# Patient Record
Sex: Female | Born: 1990 | Hispanic: Yes | Marital: Single | Smoking: Former smoker
Health system: Southern US, Community
[De-identification: ages and names within clinical notes are randomized; demographics above are authoritative.]

## PROBLEM LIST (undated history)

## (undated) DIAGNOSIS — R87619 Unspecified abnormal cytological findings in specimens from cervix uteri: Secondary | ICD-10-CM

## (undated) DIAGNOSIS — K589 Irritable bowel syndrome without diarrhea: Secondary | ICD-10-CM

## (undated) HISTORY — PX: LAPAROSCOPIC APPENDECTOMY: SUR753

## (undated) HISTORY — DX: Irritable bowel syndrome, unspecified: K58.9

## (undated) HISTORY — PX: LEEP: SHX91

## (undated) HISTORY — DX: Unspecified abnormal cytological findings in specimens from cervix uteri: R87.619

## (undated) HISTORY — PX: WISDOM TOOTH EXTRACTION: SHX21

---

## 2013-10-23 ENCOUNTER — Encounter: Payer: Self-pay | Admitting: Nurse Practitioner

## 2013-10-23 ENCOUNTER — Ambulatory Visit (INDEPENDENT_AMBULATORY_CARE_PROVIDER_SITE_OTHER): Payer: Medicaid Other | Admitting: Nurse Practitioner

## 2013-10-23 ENCOUNTER — Other Ambulatory Visit (HOSPITAL_COMMUNITY)
Admission: RE | Admit: 2013-10-23 | Discharge: 2013-10-23 | Disposition: A | Discharge status: Home / Self Care | Payer: Medicaid Other | Source: Ambulatory Visit | Attending: Nurse Practitioner | Admitting: Nurse Practitioner

## 2013-10-23 VITALS — BP 116/75 | HR 80 | Resp 16 | Ht <= 58 in | Wt 136.0 lb

## 2013-10-23 DIAGNOSIS — Z0000 Encounter for general adult medical examination without abnormal findings: Secondary | ICD-10-CM

## 2013-10-23 DIAGNOSIS — Z01419 Encounter for gynecological examination (general) (routine) without abnormal findings: Secondary | ICD-10-CM | POA: Insufficient documentation

## 2013-10-23 DIAGNOSIS — Z124 Encounter for screening for malignant neoplasm of cervix: Secondary | ICD-10-CM

## 2013-10-23 DIAGNOSIS — Z113 Encounter for screening for infections with a predominantly sexual mode of transmission: Secondary | ICD-10-CM | POA: Insufficient documentation

## 2013-10-23 DIAGNOSIS — Z30431 Encounter for routine checking of intrauterine contraceptive device: Secondary | ICD-10-CM | POA: Insufficient documentation

## 2013-10-23 DIAGNOSIS — N949 Unspecified condition associated with female genital organs and menstrual cycle: Secondary | ICD-10-CM

## 2013-10-23 DIAGNOSIS — N83201 Unspecified ovarian cyst, right side: Secondary | ICD-10-CM | POA: Insufficient documentation

## 2013-10-23 DIAGNOSIS — N83209 Unspecified ovarian cyst, unspecified side: Secondary | ICD-10-CM

## 2013-10-23 NOTE — Patient Instructions (Signed)
Ovarian Cyst An ovarian cyst is a fluid-filled sac that forms on an ovary. The ovaries are small organs that produce eggs in women. Various types of cysts can form on the ovaries. Most are not cancerous. Many do not cause problems, and they often go away on their own. Some may cause symptoms and require treatment. Common types of ovarian cysts include:  Functional cysts--These cysts may occur every month during the menstrual cycle. This is normal. The cysts usually go away with the next menstrual cycle if the woman does not get pregnant. Usually, there are no symptoms with a functional cyst.  Endometrioma cysts--These cysts form from the tissue that lines the uterus. They are also called "chocolate cysts" because they become filled with blood that turns brown. This type of cyst can cause pain in the lower abdomen during intercourse and with your menstrual period.  Cystadenoma cysts--This type develops from the cells on the outside of the ovary. These cysts can get very big and cause lower abdomen pain and pain with intercourse. This type of cyst can twist on itself, cut off its blood supply, and cause severe pain. It can also easily rupture and cause a lot of pain.  Dermoid cysts--This type of cyst is sometimes found in both ovaries. These cysts may contain different kinds of body tissue, such as skin, teeth, hair, or cartilage. They usually do not cause symptoms unless they get very big.  Theca lutein cysts--These cysts occur when too much of a certain hormone (human chorionic gonadotropin) is produced and overstimulates the ovaries to produce an egg. This is most common after procedures used to assist with the conception of a baby (in vitro fertilization). CAUSES   Fertility drugs can cause a condition in which multiple large cysts are formed on the ovaries. This is called ovarian hyperstimulation syndrome.  A condition called polycystic ovary syndrome can cause hormonal imbalances that can lead to  nonfunctional ovarian cysts. SIGNS AND SYMPTOMS  Many ovarian cysts do not cause symptoms. If symptoms are present, they may include:  Pelvic pain or pressure.  Pain in the lower abdomen.  Pain during sexual intercourse.  Increasing girth (swelling) of the abdomen.  Abnormal menstrual periods.  Increasing pain with menstrual periods.  Stopping having menstrual periods without being pregnant. DIAGNOSIS  These cysts are commonly found during a routine or annual pelvic exam. Tests may be ordered to find out more about the cyst. These tests may include:  Ultrasound.  X-ray of the pelvis.  CT scan.  MRI.  Blood tests. TREATMENT  Many ovarian cysts go away on their own without treatment. Your health care provider may want to check your cyst regularly for 2-3 months to see if it changes. For women in menopause, it is particularly important to monitor a cyst closely because of the higher rate of ovarian cancer in menopausal women. When treatment is needed, it may include any of the following:  A procedure to drain the cyst (aspiration). This may be done using a long needle and ultrasound. It can also be done through a laparoscopic procedure. This involves using a thin, lighted tube with a tiny camera on the end (laparoscope) inserted through a small incision.  Surgery to remove the whole cyst. This may be done using laparoscopic surgery or an open surgery involving a larger incision in the lower abdomen.  Hormone treatment or birth control pills. These methods are sometimes used to help dissolve a cyst. HOME CARE INSTRUCTIONS   Only take over-the-counter   or prescription medicines as directed by your health care provider.  Follow up with your health care provider as directed.  Get regular pelvic exams and Pap tests. SEEK MEDICAL CARE IF:   Your periods are late, irregular, or painful, or they stop.  Your pelvic pain or abdominal pain does not go away.  Your abdomen becomes  larger or swollen.  You have pressure on your bladder or trouble emptying your bladder completely.  You have pain during sexual intercourse.  You have feelings of fullness, pressure, or discomfort in your stomach.  You lose weight for no apparent reason.  You feel generally ill.  You become constipated.  You lose your appetite.  You develop acne.  You have an increase in body and facial hair.  You are gaining weight, without changing your exercise and eating habits.  You think you are pregnant. SEEK IMMEDIATE MEDICAL CARE IF:   You have increasing abdominal pain.  You feel sick to your stomach (nauseous), and you throw up (vomit).  You develop a fever that comes on suddenly.  You have abdominal pain during a bowel movement.  Your menstrual periods become heavier than usual. MAKE SURE YOU:  Understand these instructions.  Will watch your condition.  Will get help right away if you are not doing well or get worse. Document Released: 04/05/2005 Document Revised: 04/10/2013 Document Reviewed: 12/11/2012 Exit[employer] Patient Information 2015 [employer]. This information is not intended to replace advice given to you by your health care provider. Make sure you discuss any questions you have with your health care provider.  

## 2013-10-23 NOTE — Progress Notes (Signed)
History:  Stephanie Pratt is a 23 y.o. G1P1001 who presents to [facility] today for New GYN care. She has not had pap smear since the birth of child. Her paps have always been slightly abnormal. She has a [name] for 3 years and is happy with that for birth control but has some pains with tight clothing and with intercourse. She is afraid IUD maybe malpositioned. She is also complaining of vaginal odor. She has same sexual partner. She also had complaints of migraine without aura, anxiety, depression and nausea. No contraindications to BCP's  The following portions of the patient's history were reviewed and updated as appropriate: allergies, current medications, past family history, past medical history, past social history, past surgical history and problem list.  Review of Systems:  Pertinent items are noted in HPI.  Objective:  Physical Exam BP 116/75  Pulse 80  Resp 16  Ht 4\' 10"  (1.473 m)  Wt 136 lb (61.689 kg)  BMI 28.43 kg/m2 GENERAL: Well-developed, well-nourished female in no acute distress.  HEENT: Normocephalic, atraumatic.  NECK: Supple. Normal thyroid.  LUNGS: Normal rate. Clear to auscultation bilaterally.  HEART: Regular rate and rhythm with no adventitious sounds.  BREASTS: Symmetric in size. No masses, skin changes, nipple drainage, or lymphadenopathy. Fibrocystic breasts bilateral ABDOMEN: Soft, nontender, nondistended. No organomegaly. Normal bowel sounds appreciated in all quadrants.  PELVIC: Normal external female genitalia. Vagina is pink and rugated.  Normal discharge. Normal cervix contour. IUD strings seen. Pap smear obtained. Uterus is normal in size. Tenderness in right ovary EXTREMITIES: No cyanosis, clubbing, or edema, 2+ distal pulses.   Labs and Imaging  Transvaginal U/S done by Karie FetchLaura Clark, RN shows 4.7 cm x 3.4 cm right ovarian cyst. IUD positioned correctly  Assessment & Plan:  Assessment:  Well Woman Exam Contraception/ IUD Right  ovarian cyst Migraine  Plans: U/S today and repeat in 3 months Start Stephanie Pratt BCP for 3 months/ samples given TSH, CBC, CMET, wet prep, GC, Chlamydia, pap today Advised to make an appointment for follow up with migraines in one month  Delbert PhenixLinda M Gavinn Collard, NP 10/23/2013 10:13 AM

## 2013-10-24 ENCOUNTER — Telehealth: Payer: Self-pay | Admitting: *Deleted

## 2013-10-24 LAB — CBC
HCT: 43.3 % (ref 36.0–46.0)
HEMOGLOBIN: 14.4 g/dL (ref 12.0–15.0)
MCH: 31.7 pg (ref 26.0–34.0)
MCHC: 33.3 g/dL (ref 30.0–36.0)
MCV: 95.4 fL (ref 78.0–100.0)
PLATELETS: 247 10*3/uL (ref 150–400)
RBC: 4.54 MIL/uL (ref 3.87–5.11)
RDW: 13.8 % (ref 11.5–15.5)
WBC: 9.6 10*3/uL (ref 4.0–10.5)

## 2013-10-24 LAB — COMPREHENSIVE METABOLIC PANEL
ALBUMIN: 4.2 g/dL (ref 3.5–5.2)
AST: 11 U/L (ref 0–37)
Alkaline Phosphatase: 53 U/L (ref 39–117)
BUN: 16 mg/dL (ref 6–23)
CHLORIDE: 109 meq/L (ref 96–112)
CO2: 28 meq/L (ref 19–32)
Calcium: 8.9 mg/dL (ref 8.4–10.5)
Creat: 0.65 mg/dL (ref 0.50–1.10)
GLUCOSE: 80 mg/dL (ref 70–99)
POTASSIUM: 4.3 meq/L (ref 3.5–5.3)
SODIUM: 140 meq/L (ref 135–145)
Total Bilirubin: 0.7 mg/dL (ref 0.2–1.2)
Total Protein: 6.5 g/dL (ref 6.0–8.3)

## 2013-10-24 LAB — CYTOLOGY - PAP

## 2013-10-24 LAB — TSH: TSH: 0.62 u[IU]/mL (ref 0.350–4.500)

## 2013-10-24 NOTE — Telephone Encounter (Signed)
Pt notified of normal labs

## 2013-10-25 ENCOUNTER — Telehealth: Payer: Self-pay | Admitting: *Deleted

## 2013-10-25 DIAGNOSIS — B9689 Other specified bacterial agents as the cause of diseases classified elsewhere: Secondary | ICD-10-CM

## 2013-10-25 DIAGNOSIS — N760 Acute vaginitis: Principal | ICD-10-CM

## 2013-10-25 LAB — WET PREP, GENITAL
Trich, Wet Prep: NONE SEEN
Yeast Wet Prep HPF POC: NONE SEEN

## 2013-10-25 MED ORDER — METRONIDAZOLE 500 MG PO TABS: 500.0000 mg | ORAL_TABLET | Freq: Two times a day (BID) | ORAL | Status: DC

## 2013-10-25 NOTE — Telephone Encounter (Signed)
Message copied by Granville LewisLARK, Maressa Apollo L on Thu Oct 25, 2013  1:12 PM ------      Message from: BAREFOOT, LINDA M      Created: Thu Oct 25, 2013  1:07 PM       Mervyn GayLora, can we send in a prescription for Flagyl 500 mg BID x 7 days,      Thanks, Bonita QuinLinda ------

## 2013-11-20 ENCOUNTER — Ambulatory Visit (INDEPENDENT_AMBULATORY_CARE_PROVIDER_SITE_OTHER): Payer: Medicaid Other | Admitting: Nurse Practitioner

## 2013-11-20 ENCOUNTER — Telehealth: Payer: Self-pay

## 2013-11-20 ENCOUNTER — Encounter: Payer: Self-pay | Admitting: Nurse Practitioner

## 2013-11-20 VITALS — BP 125/85 | HR 82 | Resp 16 | Ht <= 58 in | Wt 137.0 lb

## 2013-11-20 DIAGNOSIS — M62838 Other muscle spasm: Secondary | ICD-10-CM

## 2013-11-20 DIAGNOSIS — N83201 Unspecified ovarian cyst, right side: Secondary | ICD-10-CM

## 2013-11-20 DIAGNOSIS — G4700 Insomnia, unspecified: Secondary | ICD-10-CM | POA: Insufficient documentation

## 2013-11-20 DIAGNOSIS — G43009 Migraine without aura, not intractable, without status migrainosus: Secondary | ICD-10-CM

## 2013-11-20 DIAGNOSIS — F411 Generalized anxiety disorder: Secondary | ICD-10-CM

## 2013-11-20 DIAGNOSIS — N83209 Unspecified ovarian cyst, unspecified side: Secondary | ICD-10-CM

## 2013-11-20 DIAGNOSIS — F419 Anxiety disorder, unspecified: Secondary | ICD-10-CM | POA: Insufficient documentation

## 2013-11-20 MED ORDER — CYCLOBENZAPRINE HCL 10 MG PO TABS: 10.0000 mg | ORAL_TABLET | Freq: Every evening | ORAL | Status: DC | PRN

## 2013-11-20 MED ORDER — SUMATRIPTAN SUCCINATE 100 MG PO TABS: 100.0000 mg | ORAL_TABLET | Freq: Once | ORAL | Status: DC | PRN

## 2013-11-20 MED ORDER — IBUPROFEN 800 MG PO TABS: 800.0000 mg | ORAL_TABLET | Freq: Three times a day (TID) | ORAL | Status: DC

## 2013-11-20 MED ORDER — TOPIRAMATE 25 MG PO TABS: 25.0000 mg | ORAL_TABLET | Freq: Three times a day (TID) | ORAL | Status: DC

## 2013-11-20 NOTE — Progress Notes (Signed)
Diagnosis: Migraine without Aura, insomnia, Anxiety  History:Stephanie Pratt 23 y.o. G1P1001 [facility]. She has had migraines since she was 23 years old. Her mother has severe migraines. Her main issues are stress and insomnia. She has been dealing with ill father, FOB issues, single motherhood, boyfriend issues and work issues. She has always been a light sleeper. It takes her about 2 hours to fall asleep and she is usually up only once at night. Her average is 6 hours sleep/ night. She feels she has racing thoughts at night and restlessness as well as muscle spasm in neck that keep her awake. She has been having daily migraines for about the last 3-4 years.   Location:Right and left temple  Number of Headache days/month: daily Severe:4 Moderate:20 Mild:6  Current Outpatient Prescriptions on File Prior to Visit  Medication Sig Dispense Refill  . levonorgestrel (MIRENA) 20 MCG/24HR IUD 1 each by Intrauterine route once.       No current facility-administered medications on file prior to visit.    Acute/ prevention: OTC medications  Past Medical History  Diagnosis Date  . IBS (irritable bowel syndrome)   . Abnormal Pap smear of cervix    Past Surgical History  Procedure Laterality Date  . Laparoscopic appendectomy    . Wisdom tooth extraction     Family History  Problem Relation Age of Onset  . Diabetes Father   . Heart disease Father   . Hypertension Father   . COPD Father   . Congestive Heart Failure Father   . Vascular Disease Father   . Cancer Maternal Grandmother     breast   Social History:  reports that she has been smoking Cigarettes.  She has a .6 pack-year smoking history. She has never used smokeless tobacco. She reports that she drinks alcohol. She reports that she does not use illicit drugs. Allergies: No Known Allergies  Triggers: Allergies  Birth control: Mirenia IUD  ROS: Positive for migraines,  insomnia, anxiety, muscle spasms in neck. Negative for any cardiac issues  Exam: well developed, well nourished, caucasian female  General:NAD HEENT:Negative Cardiac:RRR Lungs:clear Neuro:Negative Skin:Warm and dry  Impression:migraine - common, chronic Insomnia Anxiety Muscle spasm  Plan: Discussed the pathophysiology of migraine and medication management of migraines, insomnia, and anxiety. She will start Topamax at 25 mg x 1 week, 50 mg x 1 week then up to 75 mg till seen. She will use flexeril for muscle spasm. She will be given Imitrex and Motrin to take when she gets moderate to severe migraine. We will try to get her into some counseling. RTC 6 weeks   Time Spent:45 minutes

## 2013-11-20 NOTE — Patient Instructions (Signed)

## 2013-11-20 NOTE — Telephone Encounter (Signed)
Called to patient to let her know we have her schedule for US on 01-18-14 and also to call her primary care to get scheduled to get a referral  [facility] since [place] we will not be able to refer her. (per [facility])

## 2014-01-18 ENCOUNTER — Other Ambulatory Visit: Payer: Medicaid Other

## 2014-01-21 ENCOUNTER — Ambulatory Visit (INDEPENDENT_AMBULATORY_CARE_PROVIDER_SITE_OTHER): Payer: Medicaid Other

## 2014-01-21 DIAGNOSIS — Z975 Presence of (intrauterine) contraceptive device: Secondary | ICD-10-CM

## 2014-01-21 DIAGNOSIS — N830 Follicular cyst of ovary: Secondary | ICD-10-CM

## 2014-01-21 DIAGNOSIS — N83201 Unspecified ovarian cyst, right side: Secondary | ICD-10-CM

## 2014-01-22 ENCOUNTER — Ambulatory Visit (INDEPENDENT_AMBULATORY_CARE_PROVIDER_SITE_OTHER): Payer: Medicaid Other | Admitting: Nurse Practitioner

## 2014-01-22 ENCOUNTER — Encounter: Payer: Self-pay | Admitting: Nurse Practitioner

## 2014-01-22 VITALS — BP 113/71 | HR 81 | Resp 16 | Ht <= 58 in | Wt 140.0 lb

## 2014-01-22 DIAGNOSIS — K219 Gastro-esophageal reflux disease without esophagitis: Secondary | ICD-10-CM

## 2014-01-22 DIAGNOSIS — R102 Pelvic and perineal pain: Secondary | ICD-10-CM

## 2014-01-22 DIAGNOSIS — F419 Anxiety disorder, unspecified: Secondary | ICD-10-CM

## 2014-01-22 DIAGNOSIS — G43009 Migraine without aura, not intractable, without status migrainosus: Secondary | ICD-10-CM

## 2014-01-22 MED ORDER — LANSOPRAZOLE 15 MG PO CPDR: 15.0000 mg | DELAYED_RELEASE_CAPSULE | Freq: Every day | ORAL | Status: DC

## 2014-01-22 MED ORDER — RIZATRIPTAN BENZOATE 10 MG PO TABS: 10.0000 mg | ORAL_TABLET | ORAL | Status: DC | PRN

## 2014-01-22 NOTE — Progress Notes (Signed)
History:  Stephanie Pratt is a 23 y.o. G1P1001 who presents to [facility] clinic today for follow up with migraine headaches and review ultrasound. She is taking her topamax on a prn basis due to side effect of being sleepy. She did not like the side effects of Imitrex either. Her headaches are better and now are more tension . She got a job and that seems to have helped.   The following portions of the patient's history were reviewed and updated as appropriate: allergies, current medications, past family history, past medical history, past social history, past surgical history and problem list.  Review of Systems:    Objective:  Physical Exam BP 113/71  Pulse 81  Resp 16  Ht 4\' 10"  (1.473 m)  Wt 140 lb (63.504 kg)  BMI 29.27 kg/m2 GENERAL: Well-developed, well-nourished female in no acute distress.  HEENT: Normocephalic, atraumatic.  NECK: Supple. Normal thyroid.  LUNGS: Normal rate. Clear to auscultation bilaterally.  HEART: Regular rate and rhythm with no adventitious sounds.  EXTREMITIES: No cyanosis, clubbing, or edema, 2+ distal pulses.   Labs and Imaging US Transvaginal Non-ob  01/21/2014   CLINICAL DATA:  Right-sided ovarian cyst diagnosed 3 months ago. Patient told that this was a 3 cm right ovarian cyst. Patient has been given oral birth control pills to help reduce size of the cyst. Patient reports irregular vaginal spotting. Patient does not know her last menstrual. History of an IUD placed 4 years ago.  EXAM: TRANSABDOMINAL AND TRANSVAGINAL ULTRASOUND OF PELVIS  TECHNIQUE: Both transabdominal and transvaginal ultrasound examinations of the pelvis were performed. Transabdominal technique was performed for global imaging of the pelvis including uterus, ovaries, adnexal regions, and pelvic cul-de-sac. It was necessary to proceed with endovaginal exam following the transabdominal exam to visualize the uterus, endometrium and ovaries to better advantage.  COMPARISON:  None   FINDINGS: Uterus  Measurements: 6.7 cm x 3.7 cm x 3.8 cm. No fibroids or other mass visualized.  Endometrium  Thickness: 4.7 mm. There is shadowing from the IUD. IUD appears well positioned in the central to upper endometrial cavity. No endometrial mass or fluid.  Right ovary  Measurements: 3.7 cm x 3.0 cm x 3.4 cm. Multiple follicular cysts. Dominant cyst measures 12.6 mm in greatest dimension. No complex cysts or solid ovarian masses. Normal color Doppler blood flow to the right ovary.  Left ovary  Not visualized. Bowel gas obscures portions of the left adnexa. No adnexal mass.  Other findings  No free fluid.  IMPRESSION: 1. No acute findings. 2. Normal sized right ovarian follicular cysts. Left ovary not visualized. No adnexal masses. 3. IUD appears well positioned in the endometrial cavity.   Electronically Signed   By: Amie Portland M.D.   On: 01/21/2014 11:07   US Pelvis Complete  01/21/2014   CLINICAL DATA:  Right-sided ovarian cyst diagnosed 3 months ago. Patient told that this was a 3 cm right ovarian cyst. Patient has been given oral birth control pills to help reduce size of the cyst. Patient reports irregular vaginal spotting. Patient does not know her last menstrual. History of an IUD placed 4 years ago.  EXAM: TRANSABDOMINAL AND TRANSVAGINAL ULTRASOUND OF PELVIS  TECHNIQUE: Both transabdominal and transvaginal ultrasound examinations of the pelvis were performed. Transabdominal technique was performed for global imaging of the pelvis including uterus, ovaries, adnexal regions, and pelvic cul-de-sac. It was necessary to proceed with endovaginal exam following the transabdominal exam to visualize the uterus, endometrium and ovaries to better advantage.  COMPARISON:  None  FINDINGS: Uterus  Measurements: 6.7 cm x 3.7 cm x 3.8 cm. No fibroids or other mass visualized.  Endometrium  Thickness: 4.7 mm. There is shadowing from the IUD. IUD appears well positioned in the central to upper endometrial cavity.  No endometrial mass or fluid.  Right ovary  Measurements: 3.7 cm x 3.0 cm x 3.4 cm. Multiple follicular cysts. Dominant cyst measures 12.6 mm in greatest dimension. No complex cysts or solid ovarian masses. Normal color Doppler blood flow to the right ovary.  Left ovary  Not visualized. Bowel gas obscures portions of the left adnexa. No adnexal mass.  Other findings  No free fluid.  IMPRESSION: 1. No acute findings. 2. Normal sized right ovarian follicular cysts. Left ovary not visualized. No adnexal masses. 3. IUD appears well positioned in the endometrial cavity.   Electronically Signed   By: Amie Portlandavid  Ormond M.D.   On: 01/21/2014 11:07    Assessment & Plan:  Assessment: Migraine  Pelvic Pains GERD  Plans: Encouraged pt to take Topamax daily, may split dose in half if needed Will change Imitrex to Maxalt Reviewed U/S results Trial Prevacid Follow up 3 months  Delbert PhenixLinda M Rylon Poitra, NP 01/22/2014 4:32 PM

## 2014-01-22 NOTE — Patient Instructions (Signed)

## 2014-02-19 ENCOUNTER — Encounter: Payer: Self-pay | Admitting: Nurse Practitioner

## 2014-04-23 ENCOUNTER — Encounter: Payer: Medicaid Other | Admitting: Nurse Practitioner

## 2015-01-06 ENCOUNTER — Encounter: Payer: Self-pay | Admitting: Obstetrics and Gynecology

## 2015-01-06 ENCOUNTER — Ambulatory Visit (INDEPENDENT_AMBULATORY_CARE_PROVIDER_SITE_OTHER): Payer: Medicaid Other | Admitting: Obstetrics and Gynecology

## 2015-01-06 ENCOUNTER — Other Ambulatory Visit (HOSPITAL_COMMUNITY)
Admission: RE | Admit: 2015-01-06 | Discharge: 2015-01-06 | Disposition: A | Discharge status: Home / Self Care | Payer: Medicaid Other | Source: Ambulatory Visit | Attending: Obstetrics and Gynecology | Admitting: Obstetrics and Gynecology

## 2015-01-06 VITALS — BP 126/71 | HR 86 | Resp 16 | Ht <= 58 in | Wt 135.0 lb

## 2015-01-06 DIAGNOSIS — Z113 Encounter for screening for infections with a predominantly sexual mode of transmission: Secondary | ICD-10-CM | POA: Diagnosis not present

## 2015-01-06 DIAGNOSIS — Z124 Encounter for screening for malignant neoplasm of cervix: Secondary | ICD-10-CM | POA: Diagnosis not present

## 2015-01-06 DIAGNOSIS — Z30431 Encounter for routine checking of intrauterine contraceptive device: Secondary | ICD-10-CM

## 2015-01-06 DIAGNOSIS — Z01419 Encounter for gynecological examination (general) (routine) without abnormal findings: Secondary | ICD-10-CM

## 2015-01-06 NOTE — Progress Notes (Signed)
  Subjective:     Stephanie Pratt is a 24 y.o. female G1P1 with BMI 28 who is here for a comprehensive physical exam. The patient reports no problems. She is sexually active using IUD for contraception. She is content with her IUD.  She denies any pelvic pain or abnormal vaginal bleeding.  Social History   Social History  . Marital Status: Single    Spouse Name: N/A  . Number of Children: N/A  . Years of Education: N/A   Occupational History  . student    Social History Main Topics  . Smoking status: Light Tobacco Smoker -- 0.30 packs/day for 2 years    Types: Cigarettes  . Smokeless tobacco: Never Used  . Alcohol Use: Yes     Comment: occassional wine  . Drug Use: No  . Sexual Activity:    Partners: Male    Birth Control/ Protection: IUD   Other Topics Concern  . Not on file   Social History Narrative   Health Maintenance  Topic Date Due  . HIV Screening  12/04/2003  . TETANUS/TDAP  12/04/2007  . INFLUENZA VACCINE  11/18/2014  . PAP SMEAR  10/23/2016       Review of Systems Pertinent items are noted in HPI.   Objective:      GENERAL: Well-developed, well-nourished female in no acute distress.  HEENT: Normocephalic, atraumatic. Sclerae anicteric.  NECK: Supple. Normal thyroid.  LUNGS: Clear to auscultation bilaterally.  HEART: Regular rate and rhythm. BREASTS: Symmetric in size. No palpable masses or lymphadenopathy, skin changes, or nipple drainage. ABDOMEN: Soft, nontender, nondistended. No organomegaly. PELVIC: Normal external female genitalia. Vagina is pink and rugated.  Normal discharge. Normal appearing cervix. IUD strings visualized. Uterus is normal in size. No adnexal mass or tenderness. EXTREMITIES: No cyanosis, clubbing, or edema, 2+ distal pulses.    Assessment:    Healthy female exam.      Plan:    pap smear collected Smoking cessation discussed Mirena IUD almost 26 years old. Patient is interested in different method but may return  for another IUD insertion. Information provided on contraception options Advised patient to perform monthly self breast and vulva exam Patient will be contacted with any abnormal results RTC in a year or prn See After Visit Summary for Counseling Recommendations

## 2015-01-06 NOTE — Patient Instructions (Signed)
Contraception Choices Contraception (birth control) is the use of any methods or devices to prevent pregnancy. Below are some methods to help avoid pregnancy. HORMONAL METHODS   Contraceptive implant. This is a thin, plastic tube containing progesterone hormone. It does not contain estrogen hormone. Your health care Azul Coffie inserts the tube in the inner part of the upper arm. The tube can remain in place for up to 3 years. After 3 years, the implant must be removed. The implant prevents the ovaries from releasing an egg (ovulation), thickens the cervical mucus to prevent sperm from entering the uterus, and thins the lining of the inside of the uterus.  Progesterone-only injections. These injections are given every 3 months by your health care Rylin Seavey to prevent pregnancy. This synthetic progesterone hormone stops the ovaries from releasing eggs. It also thickens cervical mucus and changes the uterine lining. This makes it harder for sperm to survive in the uterus.  Birth control pills. These pills contain estrogen and progesterone hormone. They work by preventing the ovaries from releasing eggs (ovulation). They also cause the cervical mucus to thicken, preventing the sperm from entering the uterus. Birth control pills are prescribed by a health care Corrine Tillis.Birth control pills can also be used to treat heavy periods.  Minipill. This type of birth control pill contains only the progesterone hormone. They are taken every day of each month and must be prescribed by your health care Hennessey Cantrell.  Birth control patch. The patch contains hormones similar to those in birth control pills. It must be changed once a week and is prescribed by a health care Marcella Charlson.  Vaginal ring. The ring contains hormones similar to those in birth control pills. It is left in the vagina for 3 weeks, removed for 1 week, and then a new one is put back in place. The patient must be comfortable inserting and removing the ring  from the vagina.A health care Chaynce Schafer's prescription is necessary.  Emergency contraception. Emergency contraceptives prevent pregnancy after unprotected sexual intercourse. This pill can be taken right after sex or up to 5 days after unprotected sex. It is most effective the sooner you take the pills after having sexual intercourse. Most emergency contraceptive pills are available without a prescription. Check with your pharmacist. Do not use emergency contraception as your only form of birth control. BARRIER METHODS   Female condom. This is a thin sheath (latex or rubber) that is worn over the penis during sexual intercourse. It can be used with spermicide to increase effectiveness.  Female condom. This is a soft, loose-fitting sheath that is put into the vagina before sexual intercourse.  Diaphragm. This is a soft, latex, dome-shaped barrier that must be fitted by a health care Yandriel Boening. It is inserted into the vagina, along with a spermicidal jelly. It is inserted before intercourse. The diaphragm should be left in the vagina for 6 to 8 hours after intercourse.  Cervical cap. This is a round, soft, latex or plastic cup that fits over the cervix and must be fitted by a health care Shauna Bodkins. The cap can be left in place for up to 48 hours after intercourse.  Sponge. This is a soft, circular piece of polyurethane foam. The sponge has spermicide in it. It is inserted into the vagina after wetting it and before sexual intercourse.  Spermicides. These are chemicals that kill or block sperm from entering the cervix and uterus. They come in the form of creams, jellies, suppositories, foam, or tablets. They do not require a   prescription. They are inserted into the vagina with an applicator before having sexual intercourse. The process must be repeated every time you have sexual intercourse. INTRAUTERINE CONTRACEPTION  Intrauterine device (IUD). This is a T-shaped device that is put in a woman's uterus  during a menstrual period to prevent pregnancy. There are 2 types:  Copper IUD. This type of IUD is wrapped in copper wire and is placed inside the uterus. Copper makes the uterus and fallopian tubes produce a fluid that kills sperm. It can stay in place for 10 years.  Hormone IUD. This type of IUD contains the hormone progestin (synthetic progesterone). The hormone thickens the cervical mucus and prevents sperm from entering the uterus, and it also thins the uterine lining to prevent implantation of a fertilized egg. The hormone can weaken or kill the sperm that get into the uterus. It can stay in place for 3-5 years, depending on which type of IUD is used. PERMANENT METHODS OF CONTRACEPTION  Female tubal ligation. This is when the woman's fallopian tubes are surgically sealed, tied, or blocked to prevent the egg from traveling to the uterus.  Hysteroscopic sterilization. This involves placing a small coil or insert into each fallopian tube. Your doctor uses a technique called hysteroscopy to do the procedure. The device causes scar tissue to form. This results in permanent blockage of the fallopian tubes, so the sperm cannot fertilize the egg. It takes about 3 months after the procedure for the tubes to become blocked. You must use another form of birth control for these 3 months.  Female sterilization. This is when the female has the tubes that carry sperm tied off (vasectomy).This blocks sperm from entering the vagina during sexual intercourse. After the procedure, the man can still ejaculate fluid (semen). NATURAL PLANNING METHODS  Natural family planning. This is not having sexual intercourse or using a barrier method (condom, diaphragm, cervical cap) on days the woman could become pregnant.  Calendar method. This is keeping track of the length of each menstrual cycle and identifying when you are fertile.  Ovulation method. This is avoiding sexual intercourse during ovulation.  Symptothermal  method. This is avoiding sexual intercourse during ovulation, using a thermometer and ovulation symptoms.  Post-ovulation method. This is timing sexual intercourse after you have ovulated. Regardless of which type or method of contraception you choose, it is important that you use condoms to protect against the transmission of sexually transmitted infections (STIs). Talk with your health care Aleiya Rye about which form of contraception is most appropriate for you. Document Released: 04/05/2005 Document Revised: 04/10/2013 Document Reviewed: 09/28/2012 ExitCare Patient Information 2015 [employer]. This information is not intended to replace advice given to you by your health care Zakariya Knickerbocker. Make sure you discuss any questions you have with your health care Ritchie Klee.  

## 2015-01-08 LAB — CYTOLOGY - PAP

## 2015-05-06 ENCOUNTER — Ambulatory Visit: Payer: Medicaid Other | Admitting: Obstetrics & Gynecology

## 2015-05-08 ENCOUNTER — Ambulatory Visit (INDEPENDENT_AMBULATORY_CARE_PROVIDER_SITE_OTHER): Payer: Medicaid Other | Admitting: Obstetrics & Gynecology

## 2015-05-08 ENCOUNTER — Encounter: Payer: Self-pay | Admitting: Obstetrics & Gynecology

## 2015-05-08 VITALS — BP 120/67 | HR 88 | Resp 16 | Ht <= 58 in | Wt 133.0 lb

## 2015-05-08 DIAGNOSIS — Z30433 Encounter for removal and reinsertion of intrauterine contraceptive device: Secondary | ICD-10-CM | POA: Diagnosis not present

## 2015-05-08 DIAGNOSIS — Z01812 Encounter for preprocedural laboratory examination: Secondary | ICD-10-CM

## 2015-05-08 DIAGNOSIS — Z3202 Encounter for pregnancy test, result negative: Secondary | ICD-10-CM | POA: Diagnosis not present

## 2015-05-08 DIAGNOSIS — Z975 Presence of (intrauterine) contraceptive device: Secondary | ICD-10-CM

## 2015-05-08 LAB — POCT URINE PREGNANCY: PREG TEST UR: NEGATIVE

## 2015-05-08 MED ORDER — LEVONORGESTREL 20 MCG/24HR IU IUD
INTRAUTERINE_SYSTEM | Freq: Once | INTRAUTERINE | Status: AC
  Administered 2015-05-08: 14:00:00 via INTRAUTERINE

## 2015-05-08 NOTE — Progress Notes (Signed)
   Subjective:    Patient ID: Stephanie Pratt, female    DOB: May 14, 1990, 25 y.o.   MRN: [id number]  HPI  25 yo lady here for removal of her 27 year old Mirena and replacement with a new one. She is happy not having periods.  Review of Systems Pap smear normal 5/116    Objective:   Physical Exam  UPT negative, consent signed, Time out procedure done. I easily removed her intact Mirena. Cervix prepped with betadine and grasped with a single tooth tenaculum. Mirena was easily placed and the strings were cut to 3-4 cm. Uterus sounded to 9 cm. She tolerated the procedure well.        Assessment & Plan:  Contraception- Mirena RTC 1 year for annual

## 2015-07-03 ENCOUNTER — Encounter: Payer: Self-pay | Admitting: Obstetrics & Gynecology

## 2015-07-03 ENCOUNTER — Ambulatory Visit (INDEPENDENT_AMBULATORY_CARE_PROVIDER_SITE_OTHER): Payer: Medicaid Other | Admitting: Obstetrics & Gynecology

## 2015-07-03 VITALS — BP 122/81 | HR 76 | Resp 16 | Ht <= 58 in | Wt 136.0 lb

## 2015-07-03 DIAGNOSIS — Z30011 Encounter for initial prescription of contraceptive pills: Secondary | ICD-10-CM | POA: Diagnosis not present

## 2015-07-03 DIAGNOSIS — T8389XA Other specified complication of genitourinary prosthetic devices, implants and grafts, initial encounter: Secondary | ICD-10-CM | POA: Diagnosis not present

## 2015-07-03 DIAGNOSIS — T8384XA Pain from genitourinary prosthetic devices, implants and grafts, initial encounter: Secondary | ICD-10-CM | POA: Diagnosis not present

## 2015-07-03 DIAGNOSIS — Z30432 Encounter for removal of intrauterine contraceptive device: Secondary | ICD-10-CM | POA: Diagnosis not present

## 2015-07-03 DIAGNOSIS — T8332XA Displacement of intrauterine contraceptive device, initial encounter: Secondary | ICD-10-CM

## 2015-07-03 MED ORDER — NORETHIN ACE-ETH ESTRAD-FE 1-20 MG-MCG(24) PO TABS: 1.0000 | ORAL_TABLET | Freq: Every day | ORAL | Status: DC

## 2015-07-03 NOTE — Progress Notes (Signed)
   Subjective:    Patient ID: Stephanie Pratt, female    DOB: 20-Dec-1990, 25 y.o.   MRN: [id number]  HPI  Pt c/o pain and bleeding with the IUD placd 2 months ago.  Boyfriend can feel the IUD.  Pt denies other ymptoms.  Pian is continuous  Review of Systems  Gastrointestinal: Negative.   Genitourinary: Positive for vaginal bleeding, vaginal pain and pelvic pain.       Objective:   Physical Exam  Constitutional: She is oriented to person, place, and time. She appears well-developed and well-nourished. No distress.  HENT:  Head: Normocephalic and atraumatic.  Eyes: Conjunctivae are normal.  Pulmonary/Chest: Effort normal.  Abdominal: Soft. Bowel sounds are normal. She exhibits no distension and no mass. There is no tenderness. There is no rebound and no guarding.  Genitourinary:  Tip of IUD seen at os No lesion Vagina--no lesion  Musculoskeletal: She exhibits no edema.  Neurological: She is alert and oriented to person, place, and time.  Skin: Skin is warm and dry.  Psychiatric: She has a normal mood and affect.  Vitals reviewed.  GYNECOLOGY CLINIC PROCEDURE NOTE  Stephanie Pratt is a 25 y.o. G1P1001 here for Mirena IUD removal   IUD Removal and Reinsertion  Patient identified, informed consent performed. Discussed risks of irregular bleeding, cramping, infection, malpositioning or misplacement of the IUD outside the uterus which may require further procedures. Time out was performed. Speculum placed in the vagina. Cervix visualized. Cleaned with Betadine x 2. Grasped anteriorly with a single tooth tenaculum. The strings of the IUD were grasped and pulled using ring forceps. The IUD was successfully removed in its entirety.      Assessment & Plan:   IUD mal[position and subsequent removal  -Pt wants to start OCPS which were prescribed. -reveiwed how to take -RTC 3 months.

## 2016-06-08 ENCOUNTER — Ambulatory Visit (INDEPENDENT_AMBULATORY_CARE_PROVIDER_SITE_OTHER): Payer: Medicaid Other | Admitting: Obstetrics & Gynecology

## 2016-06-08 ENCOUNTER — Encounter: Payer: Self-pay | Admitting: Obstetrics & Gynecology

## 2016-06-08 ENCOUNTER — Other Ambulatory Visit (HOSPITAL_COMMUNITY)
Admission: RE | Admit: 2016-06-08 | Discharge: 2016-06-08 | Disposition: A | Discharge status: Home / Self Care | Payer: Medicaid Other | Source: Ambulatory Visit | Attending: Obstetrics & Gynecology | Admitting: Obstetrics & Gynecology

## 2016-06-08 VITALS — BP 121/79 | HR 89 | Resp 16 | Ht <= 58 in | Wt 136.0 lb

## 2016-06-08 DIAGNOSIS — Z01419 Encounter for gynecological examination (general) (routine) without abnormal findings: Secondary | ICD-10-CM | POA: Diagnosis not present

## 2016-06-08 DIAGNOSIS — Z0000 Encounter for general adult medical examination without abnormal findings: Secondary | ICD-10-CM | POA: Diagnosis not present

## 2016-06-08 DIAGNOSIS — Z01411 Encounter for gynecological examination (general) (routine) with abnormal findings: Secondary | ICD-10-CM | POA: Diagnosis present

## 2016-06-08 MED ORDER — LEVONORGEST-ETH ESTRAD 91-DAY 0.15-0.03 &0.01 MG PO TABS: 1.0000 | ORAL_TABLET | Freq: Every day | ORAL | 4 refills | Status: DC

## 2016-06-08 NOTE — Progress Notes (Signed)
Subjective:    Stephanie Pratt is a 26 y.o. SW 32P1 (28 yo daughter) female who presents for an annual exam. The patient has no complaints today. Happy with her OCPs except that she has dysmenorrhea and loved not having a period for the 5 years that she had her Mirena. The patient is sexually active. GYN screening history: last pap: was normal. The patient wears seatbelts: yes. The patient participates in regular exercise: yes. Has the patient ever been transfused or tattooed?: yes. The patient reports that there is not domestic violence in her life.   Menstrual History: OB History    Gravida Para Term Preterm AB Living   1 1 1     1    SAB TAB Ectopic Multiple Live Births                  Menarche age: [age] Patient's last menstrual period was 05/31/2016.    The following portions of the patient's history were reviewed and updated as appropriate: allergies, current medications, past family history, past medical history, past social history, past surgical history and problem list.  Review of Systems Pertinent items are noted in HPI.   Monogamous for 5 years, lives together Works at [employer] FH-No breast/gyn cancer ?colon cancer in mom's relatives Denies dysparenia   Objective:    BP 121/79   Pulse 89   Resp 16   Ht 4\' 10"  (1.473 m)   Wt 136 lb (61.7 kg)   LMP 05/31/2016   BMI 28.42 kg/m   General Appearance:    Alert, cooperative, no distress, appears stated age  Head:    Normocephalic, without obvious abnormality, atraumatic  Eyes:    PERRL, conjunctiva/corneas clear, EOM's intact, fundi    benign, both eyes  Ears:    Normal TM's and external ear canals, both ears  Nose:   Nares normal, septum midline, mucosa normal, no drainage    or sinus tenderness  Throat:   Lips, mucosa, and tongue normal; teeth and gums normal  Neck:   Supple, symmetrical, trachea midline, no adenopathy;    thyroid:  no enlargement/tenderness/nodules; no carotid   bruit or JVD  Back:      Symmetric, no curvature, ROM normal, no CVA tenderness  Lungs:     Clear to auscultation bilaterally, respirations unlabored  Chest Wall:    No tenderness or deformity   Heart:    Regular rate and rhythm, S1 and S2 normal, no murmur, rub   or gallop  Breast Exam:    No tenderness, masses, or nipple abnormality  Abdomen:     Soft, non-tender, bowel sounds active all four quadrants,    no masses, no organomegaly  Genitalia:    Normal female without lesion, discharge or tenderness, NSSmidplane, NT, minimal mobility, no adnexal masses or tenderness     Extremities:   Extremities normal, atraumatic, no cyanosis or edema  Pulses:   2+ and symmetric all extremities  Skin:   Skin color, texture, turgor normal, no rashes or lesions  Lymph nodes:   Cervical, supraclavicular, and axillary nodes normal  Neurologic:   CNII-XII intact, normal strength, sensation and reflexes    throughout  .    Assessment:    Healthy female exam.    Plan:     Thin prep Pap smear.   Change to [employer]

## 2016-06-11 LAB — CYTOLOGY - PAP

## 2016-06-15 ENCOUNTER — Telehealth: Payer: Self-pay | Admitting: *Deleted

## 2016-06-15 NOTE — Telephone Encounter (Signed)
-----   Message from Allie BossierMyra C Dove, MD sent at 06/14/2016  2:26 PM EST ----- She will need a colpo. Thanks

## 2016-06-15 NOTE — Telephone Encounter (Signed)
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

## 2016-06-24 ENCOUNTER — Encounter: Payer: Medicaid Other | Admitting: Obstetrics & Gynecology

## 2016-06-29 ENCOUNTER — Encounter: Payer: Medicaid Other | Admitting: Obstetrics & Gynecology

## 2018-06-30 ENCOUNTER — Ambulatory Visit (INDEPENDENT_AMBULATORY_CARE_PROVIDER_SITE_OTHER): Payer: Medicaid Other

## 2018-06-30 ENCOUNTER — Other Ambulatory Visit: Payer: Self-pay

## 2018-06-30 ENCOUNTER — Other Ambulatory Visit (HOSPITAL_COMMUNITY)
Admission: RE | Admit: 2018-06-30 | Discharge: 2018-06-30 | Disposition: A | Discharge status: Home / Self Care | Payer: Medicaid Other | Source: Ambulatory Visit

## 2018-06-30 VITALS — BP 124/79 | HR 82 | Ht 60.0 in | Wt 133.0 lb

## 2018-06-30 DIAGNOSIS — Z0000 Encounter for general adult medical examination without abnormal findings: Secondary | ICD-10-CM

## 2018-06-30 DIAGNOSIS — K649 Unspecified hemorrhoids: Secondary | ICD-10-CM

## 2018-06-30 DIAGNOSIS — Z01419 Encounter for gynecological examination (general) (routine) without abnormal findings: Secondary | ICD-10-CM

## 2018-06-30 MED ORDER — NORGESTIMATE-ETH ESTRADIOL 0.25-35 MG-MCG PO TABS: 1.0000 | ORAL_TABLET | Freq: Every day | ORAL | 11 refills | Status: DC

## 2018-06-30 MED ORDER — DOCUSATE SODIUM 250 MG PO CAPS: 250.0000 mg | ORAL_CAPSULE | Freq: Every day | ORAL | 0 refills | Status: DC

## 2018-06-30 NOTE — Progress Notes (Signed)
GYNECOLOGY ANNUAL PREVENTATIVE CARE ENCOUNTER NOTE  History:     Stephanie Pratt is a 28 y.o. G41P1001 female here for a routine annual gynecologic exam.  Current complaints: intermittent pelvic pain x3 months. She states this is an ongoing problem but has recently gotten worse in the last several months.  Denies abnormal vaginal bleeding, discharge, pelvic pain, problems with intercourse or other gynecologic concerns.    She also reports ongoing issues with constipation and a hemorrhoid.     Gynecologic History Patient's last menstrual period was 06/23/2018. Contraception: none Last Pap: unsure. Results were: patient reports abnormal with negative HPV  Obstetric History OB History  Gravida Para Term Preterm AB Living  1 1 1     1   SAB TAB Ectopic Multiple Live Births               # Outcome Date GA Lbr Len/2nd Weight Sex Delivery Anes PTL Lv  1 Term 04/30/09   6 lb 7 oz (2.92 kg) F Vag-Spont EPI      Past Medical History:  Diagnosis Date  . Abnormal Pap smear of cervix   . IBS (irritable bowel syndrome)     Past Surgical History:  Procedure Laterality Date  . LAPAROSCOPIC APPENDECTOMY    . WISDOM TOOTH EXTRACTION      Current Outpatient Medications on File Prior to Visit  Medication Sig Dispense Refill  . cyclobenzaprine (FLEXERIL) 10 MG tablet Take 1 tablet (10 mg total) by mouth at bedtime as needed for muscle spasms. (Patient not taking: Reported on 06/08/2016) 30 tablet 1  . ibuprofen (ADVIL,MOTRIN) 800 MG tablet Take 1 tablet (800 mg total) by mouth 3 (three) times daily. (Patient not taking: Reported on 06/08/2016) 60 tablet 1  . lansoprazole (PREVACID) 15 MG capsule Take 1 capsule (15 mg total) by mouth daily at 12 noon. (Patient not taking: Reported on 06/08/2016) 30 capsule 3  . levonorgestrel (MIRENA) 20 MCG/24HR IUD 1 each by Intrauterine route once.    . Levonorgestrel-Ethinyl Estradiol (AMETHIA,CAMRESE) 0.15-0.03 &0.01 MG tablet Take 1 tablet by mouth  daily. (Patient not taking: Reported on 06/30/2018) 1 Package 4  . rizatriptan (MAXALT) 10 MG tablet Take 1 tablet (10 mg total) by mouth as needed for migraine. May repeat in 2 hours if needed (Patient not taking: Reported on 06/08/2016) 12 tablet 11   No current facility-administered medications on file prior to visit.     No Known Allergies  Social History:  reports that she has been smoking cigarettes. She has a 0.60 pack-year smoking history. She has never used smokeless tobacco. She reports current alcohol use. She reports that she does not use drugs.  Family History  Problem Relation Age of Onset  . Breast cancer Mother   . Diabetes Father   . Heart disease Father   . Hypertension Father   . COPD Father   . Congestive Heart Failure Father   . Vascular Disease Father   . Cancer Maternal Grandmother        breast    The following portions of the patient's history were reviewed and updated as appropriate: allergies, current medications, past family history, past medical history, past social history, past surgical history and problem list.  Review of Systems Pertinent items noted in HPI and remainder of comprehensive ROS otherwise negative.  Physical Exam:  BP 124/79   Pulse 82   Ht 5' (1.524 m)   Wt 133 lb (60.3 kg)   LMP 06/23/2018   BMI  25.97 kg/m  CONSTITUTIONAL: Well-developed, well-nourished female in no acute distress.  HENT:  Normocephalic, atraumatic, External right and left ear normal. Oropharynx is clear and moist EYES: Conjunctivae and EOM are normal. Pupils are equal, round, and reactive to light. No scleral icterus.  NECK: Normal range of motion, supple, no masses.  Normal thyroid.  SKIN: Skin is warm and dry. No rash noted. Not diaphoretic. No erythema. No pallor. MUSCULOSKELETAL: Normal range of motion. No tenderness.  No cyanosis, clubbing, or edema.  2+ distal pulses. NEUROLOGIC: Alert and oriented to person, place, and time. Normal reflexes, muscle tone  coordination. No cranial nerve deficit noted. PSYCHIATRIC: Normal mood and affect. Normal behavior. Normal judgment and thought content. CARDIOVASCULAR: Normal heart rate noted, regular rhythm RESPIRATORY: Clear to auscultation bilaterally. Effort and breath sounds normal, no problems with respiration noted. BREASTS: Symmetric in size. No masses, skin changes, nipple drainage, or lymphadenopathy. ABDOMEN: Soft, normal bowel sounds, no distention noted.  No tenderness, rebound or guarding.  PELVIC: Normal appearing external genitalia; normal appearing vaginal mucosa and cervix.  No abnormal discharge noted.  Pap smear obtained.  Normal uterine size, no other palpable masses, no uterine or adnexal tenderness.   Assessment and Plan:    1. Well woman exam with routine gynecological exam - Will restart continuous OCPs to attempt to help with pelvic pain - Outpatient pelvic u/s ordered - Cytology - PAP( [place])  2. Hemorrhoids, unspecified hemorrhoid type - Discussed measures to help with constipation and OTC relief for hemorrhoids - Ambulatory referral to Gastroenterology  Will follow up results of pap smear and manage accordingly. Routine preventative health maintenance measures emphasized. Please refer to After Visit Summary for other counseling recommendations.   Rolm Bookbinder, CNM 06/30/18 10:36 AM

## 2018-06-30 NOTE — Progress Notes (Signed)
Pt would like to discuss birth control Last pap- 06/08/16- abnormal

## 2018-07-04 LAB — CYTOLOGY - PAP

## 2018-07-05 ENCOUNTER — Telehealth: Payer: Self-pay | Admitting: *Deleted

## 2018-07-05 NOTE — Telephone Encounter (Signed)
Pt notified of her abnormal pap and that she will need a colposcopy.  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  She was originally scheduled for colpo in 2018 but cancelled due to her insurance.  She is aware that cervical cancer is preventative and she does need to follow through with the procedure.  I am going to forward this to [employer] @ the [facility] program so that this patient can get assistance with her colposcopy.

## 2018-07-07 ENCOUNTER — Other Ambulatory Visit (HOSPITAL_COMMUNITY): Payer: Self-pay | Admitting: *Deleted

## 2018-07-07 ENCOUNTER — Telehealth: Payer: Self-pay | Admitting: *Deleted

## 2018-07-07 DIAGNOSIS — N6310 Unspecified lump in the right breast, unspecified quadrant: Secondary | ICD-10-CM

## 2018-07-07 NOTE — Telephone Encounter (Signed)
Pt has an appt with [facility] on Thursday and a Colpo with Dr Marice Potter 07/17/18 @ 2:30

## 2018-07-12 ENCOUNTER — Telehealth (HOSPITAL_COMMUNITY): Payer: Self-pay | Admitting: *Deleted

## 2018-07-12 NOTE — Telephone Encounter (Signed)
Telephoned patient at home number and left message to return call to [facility]. Need to screen patient prior to appointment.

## 2018-07-12 NOTE — Telephone Encounter (Signed)
Patient returned call to [facility]. Confirmed appointment for Thursday March 26. Screened patient for COVID-19. No symptoms and no travel.  

## 2018-07-13 ENCOUNTER — Other Ambulatory Visit: Payer: Self-pay

## 2018-07-13 ENCOUNTER — Ambulatory Visit: Payer: Medicaid Other

## 2018-07-13 ENCOUNTER — Encounter (HOSPITAL_COMMUNITY): Payer: Self-pay

## 2018-07-13 ENCOUNTER — Ambulatory Visit (HOSPITAL_COMMUNITY)
Admission: RE | Admit: 2018-07-13 | Discharge: 2018-07-13 | Disposition: A | Discharge status: Home / Self Care | Payer: Self-pay | Source: Ambulatory Visit | Attending: Obstetrics and Gynecology | Admitting: Obstetrics and Gynecology

## 2018-07-13 VITALS — BP 110/72 | Temp 99.2°F | Wt 134.0 lb

## 2018-07-13 DIAGNOSIS — Z1239 Encounter for other screening for malignant neoplasm of breast: Secondary | ICD-10-CM

## 2018-07-13 DIAGNOSIS — R87613 High grade squamous intraepithelial lesion on cytologic smear of cervix (HGSIL): Secondary | ICD-10-CM

## 2018-07-13 NOTE — Patient Instructions (Signed)
Explained breast self awareness with Phineas Semen. Patient did not need a Pap smear today due to last Pap smear was 06/30/2018. Explained the colposcopy the recommended follow-up for her abnormal Pap smear. Referred patient to the Center for [facility] at [facility] for a colposcopy to follow-up for her abnormal Pap smear. Appointment scheduled for Monday, July 17, 2018 at 1430. Patient aware of appointment and will be there. Let patient know a screening mammogram is recommended at age 68 unless clinically indicated prior. Hermione Hausknecht verbalized understanding.  Nayla Dias, Kathaleen Maser, RN 1:49 PM

## 2018-07-13 NOTE — Progress Notes (Signed)
Patient referred to [facility] by the Center for [facility] at [place] due to recommending a colposcopy to follow-up for her abnormal Pap smear on 06/30/2018.  Pap Smear: Pap smear not completed today. Last Pap smear was 06/30/2018 at the Center for [facility] at [place] and HSIL. Referred patient to the Center for [facility] Healthcare at [facility] for a colposcopy to follow-up for her abnormal Pap smear. Appointment scheduled for Monday, July 17, 2018 at 1430. Patient has a history of an abnormal Pap smear 06/08/2016 that was LSIL and no follow-up was completed. Per patient has a history of another abnormal Pap smear 8 years ago that a colposcopy was completed for follow-up. Last three Pap smear results are in Epic.  Physical exam: Breasts Breasts symmetrical. No skin abnormalities bilateral breasts. No nipple retraction bilateral breasts. No nipple discharge bilateral breasts. No lymphadenopathy. No lumps palpated bilateral breasts. No complaints of pain or tenderness on exam. Screening mammogram recommended at age 30 unless clinically indicated prior.     Pelvic/Bimanual No Pap smear completed today since last Pap smear was 06/30/2018. Pap smear not indicated per [facility] guidelines.   Smoking History: Patient is a former smoker that quit one week ago.  Patient Navigation: Patient education provided. Access to services provided for patient through [facility] program.   Breast and Cervical Cancer Risk Assessment: Patient has a family history of her mother and a maternal aunt having breast cancer. Patient has no known genetic mutations or history of radiation treatment to the chest before age 66. Patient has a history of cervical dysplasia. Patient has no history of being immunocompromised or DES exposure in-utero. Breast Cancer risk assessment completed. No breast cancer risk calculated due to patient is less than 42 years old.

## 2018-07-17 ENCOUNTER — Encounter: Payer: Self-pay | Admitting: Obstetrics & Gynecology

## 2018-07-17 ENCOUNTER — Other Ambulatory Visit: Payer: Self-pay

## 2018-07-17 ENCOUNTER — Ambulatory Visit (INDEPENDENT_AMBULATORY_CARE_PROVIDER_SITE_OTHER): Payer: Self-pay | Admitting: Obstetrics & Gynecology

## 2018-07-17 VITALS — BP 110/60 | HR 77 | Ht 60.0 in | Wt 136.0 lb

## 2018-07-17 DIAGNOSIS — Z01812 Encounter for preprocedural laboratory examination: Secondary | ICD-10-CM

## 2018-07-17 DIAGNOSIS — Z3202 Encounter for pregnancy test, result negative: Secondary | ICD-10-CM

## 2018-07-17 DIAGNOSIS — N871 Moderate cervical dysplasia: Secondary | ICD-10-CM

## 2018-07-17 DIAGNOSIS — R87613 High grade squamous intraepithelial lesion on cytologic smear of cervix (HGSIL): Secondary | ICD-10-CM

## 2018-07-17 NOTE — Progress Notes (Signed)
   Subjective:    Patient ID: Stephanie Pratt, female    DOB: 1990/10/22, 28 y.o.   MRN: [id number]  HPI 28 yo P1 here for a colpo due to HGSIL 2/20. She had a LGSIL pap 2/18 and dnka for colpo that year.   Review of Systems     Objective:   Physical Exam Breathing, conversing, and ambulating normally Well nourished, well hydrated White female, no apparent distress UPT negative, consent signed, time out done Cervix prepped with acetic acid. Transformation zone seen in its entirety. Colpo adequate. 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 I biopsied at the 12, 1 and 6 o'clock positions. Silver nitrate achieved hemostasis. ECC obtained. She tolerated the procedure well.     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

## 2018-07-31 ENCOUNTER — Encounter: Payer: Medicaid Other | Admitting: Obstetrics & Gynecology

## 2018-08-07 ENCOUNTER — Ambulatory Visit (HOSPITAL_COMMUNITY)
Admission: RE | Admit: 2018-08-07 | Discharge: 2018-08-07 | Disposition: A | Discharge status: Home / Self Care | Payer: Medicaid Other | Source: Ambulatory Visit | Attending: Obstetrics and Gynecology | Admitting: Obstetrics and Gynecology

## 2018-08-07 ENCOUNTER — Other Ambulatory Visit: Payer: Self-pay

## 2018-08-07 DIAGNOSIS — Z01419 Encounter for gynecological examination (general) (routine) without abnormal findings: Secondary | ICD-10-CM | POA: Insufficient documentation

## 2018-08-14 ENCOUNTER — Encounter: Payer: Self-pay | Admitting: Obstetrics & Gynecology

## 2018-08-14 ENCOUNTER — Ambulatory Visit: Payer: Medicaid Other | Admitting: Obstetrics & Gynecology

## 2018-08-14 ENCOUNTER — Other Ambulatory Visit: Payer: Self-pay

## 2018-08-14 VITALS — BP 122/75 | HR 79 | Resp 16 | Ht 60.0 in | Wt 136.0 lb

## 2018-08-14 DIAGNOSIS — Z23 Encounter for immunization: Secondary | ICD-10-CM

## 2018-08-14 DIAGNOSIS — Z803 Family history of malignant neoplasm of breast: Secondary | ICD-10-CM

## 2018-08-14 DIAGNOSIS — N871 Moderate cervical dysplasia: Secondary | ICD-10-CM | POA: Diagnosis not present

## 2018-08-14 DIAGNOSIS — R87619 Unspecified abnormal cytological findings in specimens from cervix uteri: Secondary | ICD-10-CM

## 2018-08-14 DIAGNOSIS — Z0000 Encounter for general adult medical examination without abnormal findings: Secondary | ICD-10-CM

## 2018-08-14 DIAGNOSIS — Z3202 Encounter for pregnancy test, result negative: Secondary | ICD-10-CM | POA: Diagnosis not present

## 2018-08-14 LAB — POCT URINE PREGNANCY: Preg Test, Ur: NEGATIVE

## 2018-08-14 NOTE — Addendum Note (Signed)
Addended by: Granville Lewis on: 08/14/2018 09:46 AM   Modules accepted: Orders

## 2018-08-14 NOTE — Progress Notes (Signed)
   Subjective:    Patient ID: Stephanie Pratt, female    DOB: Jul 22, 1990, 28 y.o.   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   Review of Systems She uses OCPs for contraception. Monogamous for 7 years    Objective:   Physical Exam Breathing, conversing, and ambulating normally Well nourished, well hydrated White female, no apparent distress  LEEP:  Risks, benefits, alternatives, and limitations of procedure explained to patient, including pain, bleeding, infection, failure to remove abnormal tissue and failure to cure dysplasia, need for repeat procedures, damage to pelvic organs, cervical incompetence.  Role of HPV,cervical dysplasia and need for close followup was empasized. Informed written consent was obtained. All questions were answered. Time out performed. Urine pregnancy test was negative.  Procedure: The patient was placed in lithotomy position and the bivalved coated speculum was placed in the patient's vagina. A grounding pad placed on the patient. Acetic acid was applied to the cervix and areas of decreased uptake were noted around the transformation zone.   Local anesthesia was administered via an intracervical block using 8 cc of 1% Lidocaine with epinephrine. The suction was turned on and the medium 1X Fisher Cone Biopsy Excisor on 50 Watts of cutting current was used to excise the entire transformation zone and any areas of visible dysplasia. I obtained an ECC.  Excellent hemostasis was achieved using roller ball coagulation set at 50 Watts coagulation current. The speculum was removed from the vagina. Specimens were sent to pathology.  The patient tolerated the procedure well. Post-operative instructions given to patient, including instruction to seek medical attention for persistent bright red bleeding, fever, abdominal/pelvic pain, dysuria, nausea or vomiting. She was also told about the possibility of having copious yellow  to black tinged discharge for weeks. She was counseled to avoid anything in the vagina (sex/douching/tampons) for 3 weeks.      Assessment & Plan:  HGSIL- await pathology Preventative care- Flu, TDAP, and Gardasil #1 today Come back for Gardasil #2 in 2 months [facility] testing due to + FH of breast cancer in mom and maternal aunt

## 2018-08-28 ENCOUNTER — Encounter (HOSPITAL_COMMUNITY): Payer: Self-pay | Admitting: *Deleted

## 2018-09-25 ENCOUNTER — Ambulatory Visit: Payer: Medicaid Other | Admitting: Obstetrics & Gynecology

## 2018-09-25 ENCOUNTER — Encounter: Payer: Self-pay | Admitting: *Deleted

## 2018-09-28 ENCOUNTER — Ambulatory Visit: Payer: Medicaid Other | Admitting: Obstetrics & Gynecology

## 2018-10-02 ENCOUNTER — Ambulatory Visit: Payer: Medicaid Other | Admitting: Obstetrics & Gynecology

## 2018-10-11 ENCOUNTER — Other Ambulatory Visit: Payer: Self-pay | Admitting: *Deleted

## 2018-10-11 DIAGNOSIS — R898 Other abnormal findings in specimens from other organs, systems and tissues: Secondary | ICD-10-CM

## 2018-10-11 NOTE — Progress Notes (Signed)
Referral placed for Dr Serita Grammes for consult for medical management with positive My Risk

## 2018-10-16 ENCOUNTER — Other Ambulatory Visit: Payer: Self-pay

## 2018-10-16 ENCOUNTER — Ambulatory Visit: Payer: Medicaid Other

## 2018-10-16 DIAGNOSIS — Z23 Encounter for immunization: Secondary | ICD-10-CM

## 2018-10-16 NOTE — Progress Notes (Signed)
Pt here for Gardasil injection only. Injection given in right deltoid.

## 2018-11-02 ENCOUNTER — Other Ambulatory Visit (HOSPITAL_COMMUNITY)
Admission: RE | Admit: 2018-11-02 | Discharge: 2018-11-02 | Disposition: A | Discharge status: Home / Self Care | Payer: Medicaid Other | Source: Ambulatory Visit | Attending: Obstetrics & Gynecology | Admitting: Obstetrics & Gynecology

## 2018-11-02 ENCOUNTER — Ambulatory Visit: Payer: Medicaid Other | Admitting: Obstetrics & Gynecology

## 2018-11-02 ENCOUNTER — Other Ambulatory Visit: Payer: Self-pay

## 2018-11-02 ENCOUNTER — Other Ambulatory Visit: Payer: Self-pay | Admitting: *Deleted

## 2018-11-02 ENCOUNTER — Encounter: Payer: Self-pay | Admitting: Obstetrics & Gynecology

## 2018-11-02 VITALS — BP 118/78 | HR 79 | Ht 62.0 in | Wt 140.0 lb

## 2018-11-02 DIAGNOSIS — N898 Other specified noninflammatory disorders of vagina: Secondary | ICD-10-CM | POA: Diagnosis present

## 2018-11-02 DIAGNOSIS — Z803 Family history of malignant neoplasm of breast: Secondary | ICD-10-CM

## 2018-11-02 MED ORDER — METRONIDAZOLE 500 MG PO TABS: 500.0000 mg | ORAL_TABLET | Freq: Two times a day (BID) | ORAL | 0 refills | Status: DC

## 2018-11-02 MED ORDER — NORGESTIMATE-ETH ESTRADIOL 0.25-35 MG-MCG PO TABS: 1.0000 | ORAL_TABLET | Freq: Every day | ORAL | 11 refills | Status: DC

## 2018-11-02 NOTE — Addendum Note (Signed)
Addended by: Lyndal Rainbow on: 11/02/2018 02:28 PM   Modules accepted: Orders

## 2018-11-02 NOTE — Progress Notes (Signed)
Subjective:    Patient ID: Stephanie Pratt, female    DOB: [birth year], 28 y.o.   MRN: [id number]  HPI 28 yo P44 (67 yo daughter) here today for a LEEP follow up. She is not having any gyn issues. She needs a refill of OCPs. She uses condoms also.  She has had a colonoscopy and endoscopy in the last 2 months.   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 Curlene Labrum

## 2018-11-06 LAB — CERVICOVAGINAL ANCILLARY ONLY
Bacterial vaginitis: POSITIVE — AB
Candida vaginitis: POSITIVE — AB
Chlamydia: NEGATIVE
Neisseria Gonorrhea: NEGATIVE
Trichomonas: NEGATIVE

## 2018-11-07 ENCOUNTER — Other Ambulatory Visit: Payer: Self-pay | Admitting: Obstetrics & Gynecology

## 2018-11-07 NOTE — Progress Notes (Signed)
Your wet prep confirmed bv (for which flagyl was prescribed). It also showed yeast and I have prescribed diflucan for this.

## 2019-02-15 ENCOUNTER — Other Ambulatory Visit: Payer: Self-pay

## 2019-02-15 ENCOUNTER — Ambulatory Visit (INDEPENDENT_AMBULATORY_CARE_PROVIDER_SITE_OTHER): Payer: Medicaid Other

## 2019-02-15 DIAGNOSIS — Z23 Encounter for immunization: Secondary | ICD-10-CM | POA: Diagnosis not present

## 2019-02-15 NOTE — Progress Notes (Signed)
Pt here for Gardasil injection. Injection given in right deltoid and tolerated well.  

## 2019-10-30 ENCOUNTER — Other Ambulatory Visit: Payer: Self-pay

## 2019-10-30 ENCOUNTER — Ambulatory Visit: Payer: Medicaid Other | Admitting: Advanced Practice Midwife

## 2019-10-30 ENCOUNTER — Other Ambulatory Visit (HOSPITAL_COMMUNITY)
Admission: RE | Admit: 2019-10-30 | Discharge: 2019-10-30 | Disposition: A | Discharge status: Home / Self Care | Payer: Medicaid Other | Source: Ambulatory Visit | Attending: Advanced Practice Midwife | Admitting: Advanced Practice Midwife

## 2019-10-30 ENCOUNTER — Encounter: Payer: Self-pay | Admitting: Advanced Practice Midwife

## 2019-10-30 VITALS — BP 130/87 | HR 81 | Ht 62.0 in | Wt 133.0 lb

## 2019-10-30 DIAGNOSIS — N3946 Mixed incontinence: Secondary | ICD-10-CM | POA: Insufficient documentation

## 2019-10-30 DIAGNOSIS — Z01419 Encounter for gynecological examination (general) (routine) without abnormal findings: Secondary | ICD-10-CM | POA: Diagnosis present

## 2019-10-30 DIAGNOSIS — Z975 Presence of (intrauterine) contraceptive device: Secondary | ICD-10-CM | POA: Insufficient documentation

## 2019-10-30 DIAGNOSIS — Z30017 Encounter for initial prescription of implantable subdermal contraceptive: Secondary | ICD-10-CM

## 2019-10-30 DIAGNOSIS — Z3202 Encounter for pregnancy test, result negative: Secondary | ICD-10-CM | POA: Diagnosis not present

## 2019-10-30 LAB — POCT URINE PREGNANCY: Preg Test, Ur: NEGATIVE

## 2019-10-30 MED ORDER — ETONOGESTREL 68 MG ~~LOC~~ IMPL
68.0000 mg | DRUG_IMPLANT | Freq: Once | SUBCUTANEOUS | Status: AC
  Administered 2019-10-30: 68 mg via SUBCUTANEOUS

## 2019-10-30 NOTE — Progress Notes (Signed)
Pt c/o irregular bleeding in the month of June

## 2019-10-30 NOTE — Progress Notes (Signed)
Subjective:     Josanna Hefel is a 29 y.o. female here at [facility] for a routine exam.  Current complaints: Urge and stress incontinence since vaginal delivery 9 years ago.  Personal health questionnaire reviewed: yes.  Do you have a primary care provider? yes   Gynecologic History Patient's last menstrual period was 10/15/2019. Contraception: OCP (estrogen/progesterone) Last Pap: 06/30/2018. Results were: abnormal with HSIL followed by colposcopy and LEEP.     Obstetric History OB History  Gravida Para Term Preterm AB Living  1 1 1     1   SAB TAB Ectopic Multiple Live Births               # Outcome Date GA Lbr Len/2nd Weight Sex Delivery Anes PTL Lv  1 Term 04/30/09   6 lb 7 oz (2.92 kg) F Vag-Spont EPI       The following portions of the patient's history were reviewed and updated as appropriate: allergies, current medications, past family history, past medical history, past social history, past surgical history and problem list.  Review of Systems Pertinent items noted in HPI and remainder of comprehensive ROS otherwise negative.    Objective:     BP 130/87   Pulse 81   Ht 5\' 2"  (1.575 m)   Wt 133 lb (60.3 kg)   LMP 10/15/2019   BMI 24.33 kg/m    VS reviewed, nursing note reviewed,  Constitutional: well developed, well nourished, no distress HEENT: normocephalic CV: normal rate Pulm/chest wall: normal effort Breast Exam:  Deferred with shared decision making Abdomen: soft Neuro: alert and oriented x 3 Skin: warm, dry Psych: affect normal Pelvic exam: Cervix pink, visually closed, without lesion, scant white creamy discharge, vaginal walls and external genitalia normal Bimanual exam: Cervix 0/long/high, firm, anterior, neg CMT, uterus nontender, nonenlarged, adnexa without tenderness, enlargement, or mass   Nexplanon Insertion Procedure Patient identified, informed consent performed, consent signed.   Patient does understand that irregular bleeding  is a very common side effect of this medication. She was advised to have backup contraception for one week after placement. Pregnancy test in clinic today was negative.  Appropriate time out taken.  Patient's left arm was prepped and draped in the usual sterile fashion.. The ruler used to measure and mark insertion area.  Patient was prepped with alcohol swab and then injected with 3 ml of 1% lidocaine.  She was prepped with betadine, Nexplanon removed from packaging,  Device confirmed in needle, then inserted full length of needle and withdrawn per handbook instructions. Nexplanon was able to palpated in the patient's arm; patient palpated the insert herself. There was minimal blood loss.  Patient insertion site covered with guaze and a pressure bandage to reduce any bruising.  The patient tolerated the procedure well and was given post procedure instructions.       Assessment/Plan:   1. Well woman exam with routine gynecological exam --No gyn complications, regular menses with OCPs except for last month, when menses was 2 weeks late. - Cytology - PAP( [place])  2. Nexplanon insertion --Pt desires Nexplanon for contraception.  Reviewed contraceptive choices in tiered fashion.  Pt prefers to receive Nexplanon at this time.   - POCT urine pregnancy, UPT negative - etonogestrel (NEXPLANON) implant 68 mg, placed without difficulty, see note above.  3. Mixed stress and urge urinary incontinence --Pt has had symptoms x 9 years, starting after childbirth.  Discussed options and pt desires to begin PT to treat symptoms.  -  Ambulatory referral to Physical Therapy    Follow up in: 1 year or as needed.   Sharen Counter, CNM 5:13 PM

## 2019-11-02 LAB — CYTOLOGY - PAP
Comment: NEGATIVE
Diagnosis: NEGATIVE
High risk HPV: NEGATIVE

## 2019-12-12 ENCOUNTER — Encounter: Payer: Self-pay | Admitting: Physical Therapy

## 2019-12-12 ENCOUNTER — Other Ambulatory Visit: Payer: Self-pay

## 2019-12-12 ENCOUNTER — Ambulatory Visit: Payer: Medicaid Other | Attending: Advanced Practice Midwife | Admitting: Physical Therapy

## 2019-12-12 DIAGNOSIS — M6281 Muscle weakness (generalized): Secondary | ICD-10-CM

## 2019-12-12 DIAGNOSIS — R278 Other lack of coordination: Secondary | ICD-10-CM | POA: Insufficient documentation

## 2019-12-12 NOTE — Therapy (Signed)
[facility] [address] [place], [place], [address] Phone: [phone]   Fax:  [phone]  Physical Therapy Evaluation  Patient Details  Name: Stephanie Pratt MRN: [id number] Date of Birth: October 04, 1990 Referring Provider (PT): Hurshel Party, CNM   Encounter Date: 12/12/2019   PT End of Session - 12/12/19 1033    Visit Number 1    Number of Visits 8    Date for PT Re-Evaluation 02/06/20    Authorization Type Medicaid [employer], first 3 visits in 30 days    PT Start Time 0935    PT Stop Time 1020    PT Time Calculation (min) 45 min    Activity Tolerance Patient tolerated treatment well    Behavior During Therapy [facility] for tasks assessed/performed           Past Medical History:  Diagnosis Date  . Abnormal Pap smear of cervix   . IBS (irritable bowel syndrome)     Past Surgical History:  Procedure Laterality Date  . LAPAROSCOPIC APPENDECTOMY    . WISDOM TOOTH EXTRACTION      There were no vitals filed for this visit.    Subjective Assessment - 12/12/19 0937    Subjective Pt referred to PT for ongoing worsening urge/stress incontinence since childbirth 10 years ago. Worse over last 5 years.    Pertinent History GERD, IBS    Diagnostic tests xray - L5/S1 DDD    Patient Stated Goals gain control over urine    Currently in Pain? No/denies              [facility] PT Assessment - 12/12/19 0001      Assessment   Medical Diagnosis N39.46 (ICD-10-CM) - Mixed stress and urge urinary incontinence    Referring Provider (PT) Leftwich-Kirby, Wilmer Floor, CNM    Onset Date/Surgical Date --   9 years ago, worsening   Next MD Visit no    Prior Therapy no      Precautions   Precautions None      Restrictions   Weight Bearing Restrictions No      Balance Screen   Has the patient fallen in the past 6 months No      Home Environment   Living Environment Private residence    Living Arrangements  Spouse/significant other;Children;Other relatives;Parent    Type of Home House      Prior Function   Level of Independence Independent    Vocation Part time employment    Vocation Requirements childcare watch at [employer]    Leisure stretch my back, curently caring for parents      Cognition   Overall Cognitive Status Within Functional Limits for tasks assessed      Observation/Other Assessments   Observations able to perform diaphragmatic breath      ROM / Strength   AROM / PROM / Strength AROM;Strength;PROM      AROM   Overall AROM Comments trunk ROM WNL      PROM   Overall PROM Comments Rt hip end range stiffness flexion, ER      Strength   Overall Strength Comments 5/5 bil LEs      Flexibility   Soft Tissue Assessment /Muscle Length yes    Hamstrings limited end range on Rt    Piriformis limited end range on Rt      Palpation   SI assessment  WNL bil  Objective measurements completed on examination: See above findings.     Pelvic Floor Special Questions - 12/12/19 0001    Prior Pelvic/Prostate Exam Yes    Result Pelvic/Prostate Exam  sometimes abnormal, LEEP done    Prior Urinalysis No    Are you Pregnant or attempting pregnancy? No    Prior Pregnancies Yes    Number of Pregnancies 1    Number of Vaginal Deliveries 1    Any difficulty with labor and deliveries Yes   prolonged pushing, hemmorhoid after delivery   Currently Sexually Active Yes    Is this Painful Yes   rare   History of sexually transmitted disease No    Urinary Leakage Yes    How often 2-3x/day, nightly    Pad use liner    Activities that cause leaking With strong urge;Coughing;Sneezing;Laughing;Lifting;Exercising;Bending    Urinary urgency Yes    Urinary frequency every 30 min in AM, every 1-2 hours rest of day   not always full, leaks after voiding sometimes   Fecal incontinence No    Fluid intake water all day    Caffeine beverages 1-2/day     Falling out feeling (prolapse) No    External Perineal Exam PT explained assessement and Pt gave verbal consent    Skin Integrity Intact;Hemorroids    Perineal Body/Introitus  Elevated    External Palpation tender along Rt side only from anterior to posterior    Prolapse Other    Prolapse other urethral    Pelvic Floor Internal Exam PT explained assessement and Pt gave verbal consent    Exam Type Vaginal    Sensation normal    Palpation mild tenderness Rt side levator and bil pubococcygeus anteriorly    Strength good squeeze, good lift, able to hold agaisnt strong resistance    Strength # of reps 3    Strength # of seconds 7   4 quick flicks before fatigue           [facility] Adult PT Treatment/Exercise - 12/12/19 0001      Self-Care   Self-Care Other Self-Care Comments    Other Self-Care Comments  urge drill, double void, bladder irritant list, timed void every 1 hour, count [place], Kegels 10x5 sec then 10 quick flicks 3x/day                    PT Short Term Goals - 12/12/19 1042      PT SHORT TERM GOAL #1   Title Pt will be ind with behavioral strategies for double void, urge drill, bladder irritant avoidance/moderation for improved control and empytying with voiding.    Baseline no knowledge    Time 3    Period Weeks    Status New    Target Date 01/02/20      PT SHORT TERM GOAL #2   Title Pt will be able to perform timed voiding every 60 min without leakage.    Baseline voids every 30 min most of day    Time 4    Period Weeks    Status New    Target Date 01/09/20      PT SHORT TERM GOAL #3   Title Pt will be able to perform 10 quick flicks without fatigue for improved urge control.    Baseline 4 quick flicks before fatigue    Time 4    Period Weeks    Status New    Target Date 01/09/20  PT Long Term Goals - 12/12/19 1045      PT LONG TERM GOAL #1   Title Pt will reduce leakage to no more than 1 episode or less per day using  techniques from HEP and behavioral strategies    Baseline 3 times per day and nightly    Time 8    Period Weeks    Status New    Target Date 02/06/20      PT LONG TERM GOAL #2   Title Pt will be able to demo proper toileting technique for defecation and report greater ease with this.    Baseline min ability to bulge, no knowledge    Time 8    Period Weeks    Status New    Target Date 02/06/20      PT LONG TERM GOAL #3   Title Pt will be able to delay voiding with timed voiding of at least 90 min between voids throughout the day without leakage.    Baseline voids every 30 min    Time 8    Period Weeks    Status New    Target Date 02/06/20      PT LONG TERM GOAL #4   Title Pt will achieve PERFECT strength score of at least 4/6/7//8 to demo improved pelvic floor strength and edurance.    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    Baseline Rt sided end range limitation in hamstrings, piriformis, Rt hip ER and flexion    Time 8    Period Weeks    Status New    Target Date 02/06/20                  Plan - 12/12/19 1034    Clinical Impression Statement Pt presents with ongoing and worsening history of urge and stress incontinence.  Onset of symtpoms after childbirth 10 years ago with worsening of symptoms over last 5 years.  She reports variable amount of leakage 2-3x/day, mostly with strong urge but also with bending, cough, sneeze, laugh.  She urinates every 30-60 min with strong urge but is not always full.  Some leakage after voiding.  She also has a history of chronic constipation, IBS and GERD.  Pt is globally fit with good mobility and strength with some end range Rt sided LE stiffness in hip and thigh flexibility.  She has pelvic floor tenderness along entire Rt side.  She  has some evidence of mild urethrocele.  PF PERFECT score for strength is 4/3/7//4.  PT gave urge drill, bladder irritants, Kegel plan and double void technique educaiton.  Asked Pt to try timed voiding every 60 min to start, and to count [place] knowing we are targeting 8+ counts.  Pt will benefit from skilled intervention to address deficits for more optimal control over bladder and bowel function.    Personal Factors and Comorbidities Time since onset of injury/illness/exacerbation;Comorbidity 1;Comorbidity 2    Comorbidities GERD, IBS    Examination-Activity Limitations Caring for Others;Squat;Lift;Toileting;Continence;Locomotion Level;Transfers;Bend;Sleep    Examination-Participation Restrictions Community Activity;Yard Work;Laundry;Cleaning;Meal Prep;Shop    Stability/Clinical Decision Making Stable/Uncomplicated    Clinical Decision Making Low    Rehab Potential Excellent    PT Frequency 1x /  week    PT Duration 12 weeks   long cert period secondary to high clinic census delaying some visits   PT Treatment/Interventions ADLs/Self Care Home Management;Biofeedback;Electrical Stimulation;Neuromuscular re-education;Therapeutic exercise;Therapeutic activities;Functional mobility training;Patient/family education;Joint Manipulations;Spinal Manipulations;Dry needling;Passive range of motion    PT Next Visit Plan Rt hip/LE stretches, f/u on HEP, abdominal massage and rectal assessment for constipation    PT Home Exercise Plan double void, urge drill, Kegels 10x5 sec + 10 quick flicks, timed void every 60 min, count [place], bladder irritants    Consulted and Agree with Plan of Care Patient           Patient will benefit from skilled therapeutic intervention in order to improve the following deficits and impairments:  Decreased strength, Impaired flexibility, Decreased range of motion, Decreased coordination  Visit Diagnosis: Muscle weakness (generalized) - Plan: PT plan of care  cert/re-cert  Other lack of coordination - Plan: PT plan of care cert/re-cert     Problem List Patient Active Problem List   Diagnosis Date Noted  . Nexplanon in place 10/30/2019  . Mixed stress and urge urinary incontinence 10/30/2019  . GERD (gastroesophageal reflux disease) 01/22/2014  . Migraine without aura 11/20/2013  . Insomnia 11/20/2013  . Anxiety 11/20/2013  . Right ovarian cyst 10/23/2013  . Well woman exam 10/23/2013    Doyce Para Wing Schoch 12/12/2019, 10:52 AM  [facility] [address], [address] [place], [place], [address] Phone: [phone]   Fax:  [phone]  Name: Marisue Canion MRN: [id number] Date of Birth: 1990/12/07

## 2019-12-12 NOTE — Patient Instructions (Signed)
Urge Incontinence  . Ideal urination frequency is every 2-4 wakeful hours, which equates to 5-8 times within a 24-hour period.   . Urge incontinence is leakage that occurs when the bladder muscle contracts, creating a sudden need to go before getting to the bathroom.   . Going too often when your bladder isn't actually full can disrupt the body's automatic signals to store and hold urine longer, which will increase urgency/frequency.  In this case, the bladder "is running the show" and strategies can be learned to retrain this pattern.   . One should be able to control the first urge to urinate, at around .  The bladder can hold up to a "grande latte," or . . To help you gain control, practice the Urge Drill below when urgency strikes.  This drill will help retrain your bladder signals and allow you to store and hold urine longer.  The overall goal is to stretch out your time between voids to reach a more manageable voiding schedule.    . Practice your "quick flicks" often throughout the day (each waking hour) even when you don't need feel the urge to go.  This will help strengthen your pelvic floor muscles, making them more effective in controlling leakage.  Urge Drill  When you feel an urge to go, follow these steps to regain control: 1. Stop what you are doing and be still 2. Take one deep breath, directing your air into your abdomen 3. Think an affirming thought, such as "I've got this." 4. Do 5 quick flicks of your pelvic floor 5. Walk with control to the bathroom to void, or delay voiding    Double Voiding can be a very useful technique to help overcome incomplete emptying of your bladder.  Incomplete emptying of urine can result in leakage after using the bathroom and increase the risk of urinary tract infection.   Initial Void: When you first sit down to urinate, ensure optimal positioning for bladder emptying by following these guidelines for toileting posture: Sit on the  toilet seat - don't hover over the seat Support your trunk by placing your hands on your knees or thighs Spread your knees and hips wide Position your feet flat on the floor or elevate feet on phone books, foot stool (Squatty Potty), or wrapped toilet paper rolls (if having knees above hips helps you empty) Lean forward from your hips Maintain the normal inward curve in your lower back   Repeated Void: After your initial void is complete, follow these movement patterns and attempt going to the bathroom again. Stand up Rotate your hips as if doing hula hoop in one direction Rotate using the same action in the other direction Rock your hips and pelvis back and forwards ("pelvic tilts") Rock your hips and pelvis side to side ("tail wag") Sit back down and repeat your voiding technique This technique can be repeated as many times as you choose to help you empty your bladder more effectively.  Bladder Irritants  Certain foods and beverages can be irritating to the bladder.  Avoiding these irritants may decrease your symptoms of urinary urgency, frequency or bladder pain.  Even reducing your intake can help with your symptoms.  Not everyone is sensitive to all bladder irritants, so you may consider focusing on one irritant at a time, removing or reducing your intake of that irritant for 7-10 days to see if this change helps your symptoms.  Water intake is also very important.  Below is a list  of bladder irritants.  Drinks: alcohol, carbonated beverages, caffeinated beverages such as coffee and tea, drinks with artificial sweeteners, citrus juices, apple juice, tomato juice  Foods: tomatoes and tomato based foods, spicy food, sugar and artificial sweeteners, vinegar, chocolate, raw onion, apples, citrus fruits, pineapple, cranberries, tomatoes, strawberries, plums, peaches, cantaloupe  Other: acidic urine (too concentrated) - see water intake info below  Substitutes you can try that are NOT  irritating to the bladder: cooked onion, pears, papayas, sun-brewed decaf teas, watermelons, non-citrus herbal teas, apricots, kava and low-acid instant drinks (Postum).    WATER INTAKE: Remember to drink lots of water (aim for fluid intake of half your body weight with 2/3 of fluids being water).  You may be limiting fluids due to fear of leakage, but this can actually worsen urgency symptoms due to highly concentrated urine.  Water helps balance the pH of your urine so it doesn't become too acidic - acidic urine is a bladder irritant!

## 2019-12-18 ENCOUNTER — Encounter: Payer: Self-pay | Admitting: Physical Therapy

## 2019-12-18 ENCOUNTER — Other Ambulatory Visit: Payer: Self-pay

## 2019-12-18 ENCOUNTER — Ambulatory Visit: Payer: Medicaid Other | Admitting: Physical Therapy

## 2019-12-18 DIAGNOSIS — M6281 Muscle weakness (generalized): Secondary | ICD-10-CM | POA: Diagnosis not present

## 2019-12-18 DIAGNOSIS — R278 Other lack of coordination: Secondary | ICD-10-CM

## 2019-12-18 NOTE — Therapy (Signed)
[facility] [address], [address] [place], [place], [address] Phone: [phone]   Fax:  [phone]  Physical Therapy Treatment  Patient Details  Name: Stephanie Pratt MRN: [id number] Date of Birth: 1990-06-09 Referring Provider (PT): Hurshel Party, CNM   Encounter Date: 12/18/2019   PT End of Session - 12/18/19 1251    Visit Number 2    Number of Visits 8    Date for PT Re-Evaluation 02/06/20    Authorization Type Medicaid [employer], first 3 visits in 30 days    Authorization - Visit Number 1    Authorization - Number of Visits 3    PT Start Time 1102    PT Stop Time 1155   heat x 10' end of session   PT Time Calculation (min) 53 min    Activity Tolerance Patient tolerated treatment well    Behavior During Therapy Stephanie Pratt for tasks assessed/performed           Past Medical History:  Diagnosis Date  . Abnormal Pap smear of cervix   . IBS (irritable bowel syndrome)     Past Surgical History:  Procedure Laterality Date  . LAPAROSCOPIC APPENDECTOMY    . WISDOM TOOTH EXTRACTION      There were no vitals filed for this visit.   Subjective Assessment - 12/18/19 1102    Subjective I am averaging 5-6 [place] when urinating.  I made it to 8 one time. I have sneezed once and didn't pee. I have been able to delay urination at times.  I can do 5x10 sec and breathe.    Pertinent History GERD, IBS    Diagnostic tests xray - L5/S1 DDD    Patient Stated Goals gain control over urine    Currently in Pain? No/denies                             [facility] Adult PT Treatment/Exercise - 12/18/19 0001      Self-Care   Self-Care Other Self-Care Comments    Other Self-Care Comments  DN aftercare instructions, continue to count [place], stretch frequency to 60-70 min, see if you can delay voiding upon sitting on toilet      Neuro Re-ed    Neuro Re-ed Details  quadruped rocking with TC/VC for  sinking femoral heads, opening posterior PF      Exercises   Exercises Lumbar;Knee/Hip      Lumbar Exercises: Stretches   Piriformis Stretch Right;2 reps;20 seconds    Piriformis Stretch Limitations deep knee cross and drop knees to side    Figure 4 Stretch 2 reps;20 seconds;With overpressure;Supine    Figure 4 Stretch Limitations Right    Other Lumbar Stretch Exercise quadruped rocking for PF release, draped over red ball rocking and costal expansion breathing    Other Lumbar Stretch Exercise happy baby x 60'      Knee/Hip Exercises: Supine   Other Supine Knee/Hip Exercises windshield wiper hips supine x 10      Modalities   Modalities Moist Heat      Moist Heat Therapy   Number Minutes Moist Heat 10 Minutes    Moist Heat Location Hip   Rt     Manual Therapy   Manual Therapy Soft tissue mobilization    Soft tissue mobilization Rt lateral and posterior hip after DN            Trigger Point Dry Needling - 12/18/19 0001  Consent Given? Yes    Education Handout Provided Yes    Muscles Treated Back/Hip Gluteus medius;Piriformis    Dry Needling Comments Rt only    Gluteus Medius Response Twitch response elicited;Palpable increased muscle length    Piriformis Response Twitch response elicited;Palpable increased muscle length                  PT Short Term Goals - 12/18/19 1257      PT SHORT TERM GOAL #1   Title Pt will be ind with behavioral strategies for double void, urge drill, bladder irritant avoidance/moderation for improved control and empytying with voiding.    Status Achieved      PT SHORT TERM GOAL #2   Title Pt will be able to perform timed voiding every 60 min without leakage.    Baseline except for AMs with coffee consumption    Status Achieved      PT SHORT TERM GOAL #3   Title Pt will be able to perform 10 quick flicks without fatigue for improved urge control.    Status On-going             PT Long Term Goals - 12/18/19 1258      PT  LONG TERM GOAL #1   Title Pt will reduce leakage to no more than 1 episode or less per day using techniques from HEP and behavioral strategies    Status On-going      PT LONG TERM GOAL #2   Title Pt will be able to demo proper toileting technique for defecation and report greater ease with this.    Status New      PT LONG TERM GOAL #3   Title Pt will be able to delay voiding with timed voiding of at least 90 min between voids throughout the day without leakage.    Baseline every 60 min    Status On-going      PT LONG TERM GOAL #4   Title Pt will achieve PERFECT strength score of at least 4/6/7//8 to demo improved pelvic floor strength and edurance.    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    Status On-going                 Plan - 12/18/19 1252    Clinical Impression Statement Pt is making good progress with putting strategies into place at home with some success so far.  She reports she is mostly able to delay voiding for frequency of every 60 min with exception of AM when coffee has been a trigger.  She has had some success using urge drill and the Knack to avoid leakage and delay voiding.  PT introduced Rt hip and lumbar/PF stretches for HEP and performed DN to restricted Rt hip musculature today with signif twitch and release.  Aftercare given for DN.  PT encouraged Pt to spread void frequency to 60-70 min as able, delay voiding once seated on toilet, and continue counting [place]is (she is currently at 6-7 and one time got to 8) for awareness of how full her bladder is when she has trigger to void.  Pt will continue to benefit from skilled PT along POC for improved PF function.    Comorbidities GERD, IBS    Rehab Potential Excellent    PT Frequency 1x / week  PT Duration 12 weeks    PT Treatment/Interventions ADLs/Self Care Home  Management;Biofeedback;Electrical Stimulation;Neuromuscular re-education;Therapeutic exercise;Therapeutic activities;Functional mobility training;Patient/family education;Joint Manipulations;Spinal Manipulations;Dry needling;Passive range of motion    PT Next Visit Plan f/u on HEP stretches, f/u on Rt hip DN, do abdominal massage and recheck PF contractions, anorectal assessment or vaginal canal PF release    PT Home Exercise Plan double void, urge drill, Kegels 10x5 sec + 10 quick flicks, timed void every 60 min, count [place]is, bladder irritants    Consulted and Agree with Plan of Care Patient           Patient will benefit from skilled therapeutic intervention in order to improve the following deficits and impairments:     Visit Diagnosis: Muscle weakness (generalized)  Other lack of coordination     Problem List Patient Active Problem List   Diagnosis Date Noted  . Nexplanon in place 10/30/2019  . Mixed stress and urge urinary incontinence 10/30/2019  . GERD (gastroesophageal reflux disease) 01/22/2014  . Migraine without aura 11/20/2013  . Insomnia 11/20/2013  . Anxiety 11/20/2013  . Right ovarian cyst 10/23/2013  . Well woman exam 10/23/2013    Morton Peters, PT 12/18/19 1:00 PM   [facility] [address], [address] [place], [place], [address] Phone: [phone]   Fax:  [phone]  Name: Stephanie Pratt MRN: [id number] Date of Birth: February 16, 1991

## 2019-12-18 NOTE — Patient Instructions (Addendum)
Trigger Point Dry Needling  . What is Trigger Point Dry Needling (DN)? o DN is a physical therapy technique used to treat muscle pain and dysfunction. Specifically, DN helps deactivate muscle trigger points (muscle knots).  o A thin filiform needle is used to penetrate the skin and stimulate the underlying trigger point. The goal is for a local twitch response (LTR) to occur and for the trigger point to relax. No medication of any kind is injected during the procedure.   . What Does Trigger Point Dry Needling Feel Like?  o The procedure feels different for each individual patient. Some patients report that they do not actually feel the needle enter the skin and overall the process is not painful. Very mild bleeding may occur. However, many patients feel a deep cramping in the muscle in which the needle was inserted. This is the local twitch response.   Marland Kitchen How Will I feel after the treatment? o Soreness is normal, and the onset of soreness may not occur for a few hours. Typically this soreness does not last longer than two days.  o Bruising is uncommon, however; ice can be used to decrease any possible bruising.  o In rare cases feeling tired or nauseous after the treatment is normal. In addition, your symptoms may get worse before they get better, this period will typically not last longer than 24 hours.   . What Can I do After My Treatment? o Increase your hydration by drinking more water for the next 24 hours. o You may place ice or heat on the areas treated that have become sore, however, do not use heat on inflamed or bruised areas. Heat often brings more relief post needling. o You can continue your regular activities, but vigorous activity is not recommended initially after the treatment for 24 hours. o DN is best combined with other physical therapy such as strengthening, stretching, and other therapies.    [facility] [address], [address] [place], [place]  [address] Phone # [phone] Fax [phone]  Access Code: [email or web address] URL: [email or web address]: 08/31/2021Prepared by: Loistine Simas BeuhringExercises  Supine Hip Internal and External Rotation - 1 x daily - 7 x weekly - 1 sets - 10 reps  Supine Pelvic Floor Stretch - 1 x daily - 7 x weekly - 2 sets - 3 reps - 30 hold  Supine Figure 4 Piriformis Stretch - 1 x daily - 7 x weekly - 2 sets - 3 reps - 30 hold  Supine Piriformis Stretch with Foot on Ground - 1 x daily - 7 x weekly - 1 sets - 3 reps - 30 hold  Quadruped Rocking Backward - 1 x daily - 7 x weekly - 3 sets - 10 reps

## 2019-12-28 ENCOUNTER — Encounter: Payer: Medicaid Other | Admitting: Physical Therapy

## 2019-12-31 ENCOUNTER — Encounter: Payer: Medicaid Other | Admitting: Physical Therapy

## 2020-01-07 ENCOUNTER — Encounter: Payer: Medicaid Other | Admitting: Physical Therapy

## 2020-01-22 ENCOUNTER — Other Ambulatory Visit: Payer: Self-pay

## 2020-01-22 ENCOUNTER — Ambulatory Visit: Payer: Medicaid Other | Attending: Advanced Practice Midwife | Admitting: Physical Therapy

## 2020-01-22 ENCOUNTER — Encounter: Payer: Self-pay | Admitting: Physical Therapy

## 2020-01-22 DIAGNOSIS — M6281 Muscle weakness (generalized): Secondary | ICD-10-CM

## 2020-01-22 DIAGNOSIS — R278 Other lack of coordination: Secondary | ICD-10-CM | POA: Diagnosis present

## 2020-01-22 NOTE — Patient Instructions (Signed)
Toileting Techniques for Bowel Movements (Defecation) Using your belly (abdomen) and pelvic floor muscles to have a bowel movement is usually instinctive.  Sometimes people can have problems with these muscles and have to relearn proper defecation (emptying) techniques.  If you have weakness in your muscles, organs that are falling out, decreased sensation in your pelvis, or ignore your urge to go, you may find yourself straining to have a bowel movement.  You are straining if you are: . holding your breath or taking in a huge gulp of air and holding it  . keeping your lips and jaw tensed and closed tightly . turning red in the face because of excessive pushing or forcing . developing or worsening your  hemorrhoids . getting faint while pushing . not emptying completely and have to defecate many times a day  If you are straining, you are actually making it harder for yourself to have a bowel movement.  Many people find they are pulling up with the pelvic floor muscles and closing off instead of opening the anus. Due to lack pelvic floor relaxation and coordination the abdominal muscles, one has to work harder to push the feces out.  Many people have never been taught how to defecate efficiently and effectively.  Notice what happens to your body when you are having a bowel movement.  While you are sitting on the toilet pay attention to the following areas: . Jaw and mouth position . Angle of your hips   . Whether your feet touch the ground or not . Arm placement  . Spine position . Waist . Belly tension . Anus (opening of the anal canal)  An Evacuation/Defecation Plan   Here are the 4 basic points:  1. Lean forward enough for your elbows to rest on your knees 2. Support your feet on the floor or use a low stool if your feet don't touch the floor  3. Push out your belly as if you have swallowed a beach ball--you should feel a widening of your waist 4. Open and relax your pelvic floor  muscles, rather than tightening around the anus       The following conditions my require modifications to your toileting posture:  . If you have had surgery in the past that limits your back, hip, pelvic, knee or ankle flexibility . Constipation   Your healthcare practitioner may make the following additional suggestions and adjustments:  1) Sit on the toilet  a) Make sure your feet are supported. b) Notice your hip angle and spine position--most people find it effective to lean forward or raise their knees, which can help the muscles around the anus to relax  c) When you lean forward, place your forearms on your thighs for support  2) Relax suggestions a) Breath deeply in through your nose and out slowly through your mouth as if you are smelling the flowers and blowing out the candles. b) To become aware of how to relax your muscles, contracting and releasing muscles can be helpful.  Pull your pelvic floor muscles in tightly by using the image of holding back gas, or closing around the anus (visualize making a circle smaller) and lifting the anus up and in.  Then release the muscles and your anus should drop down and feel open. Repeat 5 times ending with the feeling of relaxation. c) Keep your pelvic floor muscles relaxed; let your belly bulge out. d) The digestive tract starts at the mouth and ends at the anal opening, so   be sure to relax both ends of the tube.  Place your tongue on the roof of your mouth with your teeth separated.  This helps relax your mouth and will help to relax the anus at the same time.  3) Empty (defecation) a) Keep your pelvic floor and sphincter relaxed, then bulge your anal muscles.  Make the anal opening wide.  b) Stick your belly out as if you have swallowed a beach ball. c) Make your belly wall hard using your belly muscles while continuing to breathe. Doing this makes it easier to open your anus. d) Breath out and give a grunt (or try using other sounds  such as ahhhh, shhhhh, ohhhh or grrrrrrr).  4) Finish a) As you finish your bowel movement, pull the pelvic floor muscles up and in.  This will leave your anus in the proper place rather than remaining pushed out and down. If you leave your anus pushed out and down, it will start to feel as though that is normal and give you incorrect signals about needing to have a bowel movement.  

## 2020-01-22 NOTE — Therapy (Signed)
[facility] [address], [address] [place], [place], [address] Phone: [phone]   Fax:  [phone]  Physical Therapy Treatment  Patient Details  Name: Stephanie Pratt MRN: [id number] Date of Birth: 03-Jun-1990 Referring Provider (PT): Hurshel Party, CNM   Encounter Date: 01/22/2020   PT End of Session - 01/22/20 1727    Visit Number 3    Number of Visits 12    Date for PT Re-Evaluation 03/05/20    Authorization Type Medicaid [employer]    Authorization Time Period 01/22/20 to 04/22/19    Authorization - Visit Number 3    Authorization - Number of Visits 12    PT Start Time 1445    PT Stop Time 1530    PT Time Calculation (min) 45 min    Activity Tolerance Patient tolerated treatment well    Behavior During Therapy [facility] for tasks assessed/performed           Past Medical History:  Diagnosis Date  . Abnormal Pap smear of cervix   . IBS (irritable bowel syndrome)     Past Surgical History:  Procedure Laterality Date  . LAPAROSCOPIC APPENDECTOMY    . WISDOM TOOTH EXTRACTION      There were no vitals filed for this visit.   Subjective Assessment - 01/22/20 1450    Subjective The DN really helped but I've had COVID since last here and have been inactive and cooped up so kind of tight again.  I can delay voiding 3-4 hours.  Practicing urge drill to do so.    Pertinent History GERD, IBS    Diagnostic tests xray - L5/S1 DDD    Patient Stated Goals gain control over urine    Currently in Pain? No/denies                             [facility] Adult PT Treatment/Exercise - 01/22/20 0001      Neuro Re-ed    Neuro Re-ed Details  seated on yoga blocks, feet on squatty potty breathing and bulging for toileting, increase PF contractions to 10x10 sec from 10x5 sec      Manual Therapy   Manual Therapy Myofascial release    Soft tissue mobilization abdominal massage kneading, circles    Myofascial  Release bladder fascial release inferiorly and bil, thoracodorsal fascial release bil, midline skin rolling and pinch and pick ups midline lumbar                     PT Short Term Goals - 01/22/20 1455      PT SHORT TERM GOAL #1   Title Pt will be ind with behavioral strategies for double void, urge drill, bladder irritant avoidance/moderation for improved control and empytying with voiding.    Baseline urge drill, double void    Status Achieved      PT SHORT TERM GOAL #2   Title Pt will be able to perform timed voiding every 60 min without leakage.    Baseline can delay 3-4 hours    Status Achieved      PT SHORT TERM GOAL #3   Title Pt will be able to perform 10 quick flicks without fatigue for improved urge control.    Baseline 10    Status Achieved             PT Long Term Goals - 01/22/20 1457      PT LONG  TERM GOAL #1   Title Pt will reduce leakage to no more than 1 episode or less per day using techniques from HEP and behavioral strategies    Baseline 3-4x/night    Status Achieved      PT LONG TERM GOAL #2   Title Pt will be able to demo proper toileting technique for defecation and report greater ease with this.      PT LONG TERM GOAL #3   Title Pt will be able to delay voiding with timed voiding of at least 90 min between voids throughout the day without leakage.    Status Achieved      PT LONG TERM GOAL #4   Title Pt will achieve PERFECT strength score of at least 4/6/7//8 to demo improved pelvic floor strength and edurance.    Status On-going                 Plan - 01/22/20 1729    Clinical Impression Statement Pt with several week break from PT due to having COVID.  Symptoms with this were mild.  She has been able to restore normal bladder voiding times during the day using Urge Drill up to 3-4 hours of holding urine before toileting.  Urge contiues at night 3-4 times.  PT encouraged her to count [place] at night and compare to during  day to see if she can use this data to help her use Urge Drill with confidence at night as well.  PT educated Pt on toileting techniques and intiated coaching on breathe/bulge seated on yoga blocks today.  PT also increased PF contraction time to 10x10 sec vs 5 sec as Pt has built strength and confidence with this.  She has had only 1 episode of leakage since last here and this was with a sneeze, which is a huge improvement for her.  PT performed myofascial release around bladder and thoracic/lumbar region today to see if this is contributing to urgency.  Continue along POC.    Comorbidities GERD, IBS    PT Next Visit Plan f/u on 10x10 PF contractions, night urge drill working/[place]is at night?, review toileting bulge, do abdominal massage and myofascial release bladder/lumbar, fascial release around urethra    PT Home Exercise Plan double void, urge drill, Kegels 10x5 sec + 10 quick flicks, timed void every 60 min, count [place], bladder irritants    Consulted and Agree with Plan of Care Patient           Patient will benefit from skilled therapeutic intervention in order to improve the following deficits and impairments:     Visit Diagnosis: Muscle weakness (generalized)  Other lack of coordination     Problem List Patient Active Problem List   Diagnosis Date Noted  . Nexplanon in place 10/30/2019  . Mixed stress and urge urinary incontinence 10/30/2019  . GERD (gastroesophageal reflux disease) 01/22/2014  . Migraine without aura 11/20/2013  . Insomnia 11/20/2013  . Anxiety 11/20/2013  . Right ovarian cyst 10/23/2013  . Well woman exam 10/23/2013    Morton Peters, PT 01/22/20 5:34 PM   [facility] [address] [place], [place], [address] Phone: [phone]   Fax:  [phone]  Name: Andreana Klingerman MRN: [id number] Date of Birth: 10-08-1990

## 2020-01-30 ENCOUNTER — Ambulatory Visit: Payer: Medicaid Other | Admitting: Physical Therapy

## 2020-01-30 ENCOUNTER — Other Ambulatory Visit: Payer: Self-pay

## 2020-01-30 ENCOUNTER — Encounter: Payer: Self-pay | Admitting: Physical Therapy

## 2020-01-30 DIAGNOSIS — R278 Other lack of coordination: Secondary | ICD-10-CM

## 2020-01-30 DIAGNOSIS — M6281 Muscle weakness (generalized): Secondary | ICD-10-CM

## 2020-01-30 NOTE — Therapy (Signed)
[facility] [address] [place], [place], [address] Phone: [phone]   Fax:  [phone]  Physical Therapy Treatment  Patient Details  Name: Stephanie Pratt MRN: [id number] Date of Birth: 1990-11-10 Referring Provider (PT): Hurshel Party, CNM   Encounter Date: 01/30/2020   PT End of Session - 01/30/20 1148    Visit Number 4    Number of Visits 12    Date for PT Re-Evaluation 03/05/20    Authorization Type Medicaid [employer]    Authorization Time Period 01/22/20 to 04/22/19    Authorization - Visit Number 4    Authorization - Number of Visits 12    PT Start Time 1145    PT Stop Time 1230    PT Time Calculation (min) 45 min    Activity Tolerance Patient tolerated treatment well    Behavior During Therapy [facility] for tasks assessed/performed           Past Medical History:  Diagnosis Date  . Abnormal Pap smear of cervix   . IBS (irritable bowel syndrome)     Past Surgical History:  Procedure Laterality Date  . LAPAROSCOPIC APPENDECTOMY    . WISDOM TOOTH EXTRACTION      There were no vitals filed for this visit.   Subjective Assessment - 01/30/20 1154    Subjective I am only counting 5-8 [place] at night, I think I just wake up and go.  I am trying to not go and if I don't I have signif urge on waking in AM.  I had blood vs stool the day after I was last here when we did abdominal massage.    Pertinent History GERD, IBS    Diagnostic tests xray - L5/S1 DDD    Patient Stated Goals gain control over urine    Currently in Pain? No/denies                             [facility] Adult PT Treatment/Exercise - 01/30/20 0001      Self-Care   Other Self-Care Comments  urge drill at night given low volume with nighttime voids      Knee/Hip Exercises: Aerobic   Recumbent Bike L2 x 6' PT present to discuss status      Manual Therapy   Manual Therapy Myofascial release;Soft tissue  mobilization    Soft tissue mobilization bil hips after DN    Myofascial Release bil fascial release around urethra (internal with consent), bil thoracodorsal fascia and skin rolling midline lumbar            Trigger Point Dry Needling - 01/30/20 0001    Consent Given? Yes    Education Handout Provided Previously provided    Muscles Treated Back/Hip Gluteus minimus;Piriformis    Dry Needling Comments piriformis bil, glut min Lt only    Gluteus Minimus Response Twitch response elicited;Palpable increased muscle length    Piriformis Response Twitch response elicited;Palpable increased muscle length                  PT Short Term Goals - 01/22/20 1455      PT SHORT TERM GOAL #1   Title Pt will be ind with behavioral strategies for double void, urge drill, bladder irritant avoidance/moderation for improved control and empytying with voiding.    Baseline urge drill, double void    Status Achieved      PT SHORT TERM GOAL #2  Title Pt will be able to perform timed voiding every 60 min without leakage.    Baseline can delay 3-4 hours    Status Achieved      PT SHORT TERM GOAL #3   Title Pt will be able to perform 10 quick flicks without fatigue for improved urge control.    Baseline 10    Status Achieved             PT Long Term Goals - 01/22/20 1457      PT LONG TERM GOAL #1   Title Pt will reduce leakage to no more than 1 episode or less per day using techniques from HEP and behavioral strategies    Baseline 3-4x/night    Status Achieved      PT LONG TERM GOAL #2   Title Pt will be able to demo proper toileting technique for defecation and report greater ease with this.      PT LONG TERM GOAL #3   Title Pt will be able to delay voiding with timed voiding of at least 90 min between voids throughout the day without leakage.    Status Achieved      PT LONG TERM GOAL #4   Title Pt will achieve PERFECT strength score of at least 4/6/7//8 to demo improved pelvic  floor strength and edurance.    Status On-going                 Plan - 01/30/20 1235    Clinical Impression Statement Pt with continued improvement in urgency/frequency during the day.  Nighttime voids yield a count of less than 8 seconds, suggesting Pt may be able to delay void and reduce nighttime interruptions.  She states she wakes up a lot and may be habit to go.  PT encouraged urge drill at night and continued Kegel practice to help when bladder is more full for control.  PT performed DN to bil hips and myofascial release around urethra and lumbar spine to see if this reduces urgency.  She has initial urge with urethral release which reduced with corresponding tissue release palpated by PT.  Pt will continue to benefit from skilled PT with ongoing assessment.    Comorbidities GERD, IBS    PT Next Visit Plan f/u on urge at night and ability to delay and go back to sleep, f/u on bil hip DN, did Pt contact MD re: blood in stool, fascial release urethra, recheck goals and Kegel strength, DO CORE    PT Home Exercise Plan double void, urge drill, Kegels 10x5 sec + 10 quick flicks, timed void every 60 min, count [place], bladder irritants    Consulted and Agree with Plan of Care Patient           Patient will benefit from skilled therapeutic intervention in order to improve the following deficits and impairments:     Visit Diagnosis: Muscle weakness (generalized)  Other lack of coordination     Problem List Patient Active Problem List   Diagnosis Date Noted  . Nexplanon in place 10/30/2019  . Mixed stress and urge urinary incontinence 10/30/2019  . GERD (gastroesophageal reflux disease) 01/22/2014  . Migraine without aura 11/20/2013  . Insomnia 11/20/2013  . Anxiety 11/20/2013  . Right ovarian cyst 10/23/2013  . Well woman exam 10/23/2013    Morton Peters, PT 01/30/20 12:41 PM   [facility] [address] [place], [place], [address] Phone: [phone]   Fax:  [phone]  Name: Stephanie Pratt MRN: [id number] Date of Birth: [birth year]

## 2020-02-04 ENCOUNTER — Other Ambulatory Visit: Payer: Self-pay

## 2020-02-04 ENCOUNTER — Encounter: Payer: Self-pay | Admitting: Physical Therapy

## 2020-02-04 ENCOUNTER — Ambulatory Visit: Payer: Medicaid Other | Admitting: Physical Therapy

## 2020-02-04 DIAGNOSIS — R278 Other lack of coordination: Secondary | ICD-10-CM

## 2020-02-04 DIAGNOSIS — M6281 Muscle weakness (generalized): Secondary | ICD-10-CM

## 2020-02-04 NOTE — Therapy (Signed)
[facility] [address], [place], [place], [address] Phone: [phone]   Fax:  [phone]  Physical Therapy Treatment  Patient Details  Name: Stephanie Pratt MRN: [id number] Date of Birth: April 02, 1991 Referring Provider (PT): Elvera Maria, CNM   Encounter Date: 02/04/2020   PT End of Session - 02/04/20 1132    Visit Number 5    Number of Visits 12    Date for PT Re-Evaluation 03/05/20    Authorization Type Medicaid [employer]    Authorization Time Period 01/22/20 to 04/22/19    Authorization - Visit Number 5    Authorization - Number of Visits 12    PT Start Time 5027   Pt early and PT available   PT Stop Time 1230   heat end of session   PT Time Calculation (min) 60 min    Activity Tolerance Patient tolerated treatment well    Behavior During Therapy [facility] for tasks assessed/performed           Past Medical History:  Diagnosis Date  . Abnormal Pap smear of cervix   . IBS (irritable bowel syndrome)     Past Surgical History:  Procedure Laterality Date  . LAPAROSCOPIC APPENDECTOMY    . WISDOM TOOTH EXTRACTION      There were no vitals filed for this visit.   Subjective Assessment - 02/04/20 1129    Subjective Rt hip is much better but Lt hip is cramping since the DN last time.  At the bottom of the buttock and into back of leg.  Urge at night is better but now when I do get up with a more full bladder it is an effort to make it to the bathroom.    Pertinent History GERD, IBS    Diagnostic tests xray - L5/S1 DDD    Patient Stated Goals gain control over urine    Currently in Pain? Yes    Pain Score 4     Pain Location Buttocks    Pain Orientation Left;Posterior    Pain Descriptors / Indicators Cramping    Pain Type Acute pain    Pain Radiating Towards Lt posterior thigh    Pain Onset In the past 7 days    Pain Frequency Intermittent    Aggravating Factors  squatting, bending                              [facility] Adult PT Treatment/Exercise - 02/04/20 0001      Neuro Re-ed    Neuro Re-ed Details  SL PF/TrA combo with respiration cycles, add ball squeeze for improved awareness of TrA and anterior PF, PT gave VCs and TCs, 2x10 breath cycles      Lumbar Exercises: Stretches   Passive Hamstring Stretch Left;2 reps;30 seconds      Lumbar Exercises: Aerobic   Recumbent Bike L2 x 6' PT present to discuss symptoms and progress      Moist Heat Therapy   Number Minutes Moist Heat 10 Minutes    Moist Heat Location Hip   Lt hamstring     Manual Therapy   Soft tissue mobilization Lt hamstring after DN            Trigger Point Dry Needling - 02/04/20 0001    Consent Given? Yes    Education Handout Provided Previously provided    Muscles Treated Lower Quadrant Hamstring    Dry Needling Comments Left  lateral     Hamstring Response Twitch response elicited;Palpable increased muscle length                  PT Short Term Goals - 01/22/20 1455      PT SHORT TERM GOAL #1   Title Pt will be ind with behavioral strategies for double void, urge drill, bladder irritant avoidance/moderation for improved control and empytying with voiding.    Baseline urge drill, double void    Status Achieved      PT SHORT TERM GOAL #2   Title Pt will be able to perform timed voiding every 60 min without leakage.    Baseline can delay 3-4 hours    Status Achieved      PT SHORT TERM GOAL #3   Title Pt will be able to perform 10 quick flicks without fatigue for improved urge control.    Baseline 10    Status Achieved             PT Long Term Goals - 02/04/20 1133      PT LONG TERM GOAL #1   Title Pt will reduce leakage to no more than 1 episode or less per day using techniques from HEP and behavioral strategies    Baseline 3-4x/night    Status Achieved      PT LONG TERM GOAL #2   Title Pt will be able to demo proper toileting technique for defecation and  report greater ease with this.    Status On-going      PT LONG TERM GOAL #3   Title Pt will be able to delay voiding with timed voiding of at least 90 min between voids throughout the day without leakage.    Status Achieved      PT LONG TERM GOAL #4   Title Pt will achieve PERFECT strength score of at least 4/6/7//8 to demo improved pelvic floor strength and edurance.      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    Baseline working on hamstrings, piriformis much improved    Status On-going      Additional Long Term Goals   Additional Long Term Goals Yes      PT LONG TERM GOAL #6   Title Pt will report reduced urge by at least 50% with transition from bed to toilet on waking after longer stretch of sleep.    Time 6    Period Weeks    Status New    Target Date 03/17/20      PT LONG TERM GOAL #7   Title Pt will be able to demo proper core canister recruitment with dynamic anti-gravity functional tasks such as sit to stand, squat, lift and carry.    Time 6    Period Weeks    Status New    Target Date 03/17/20                 Plan - 02/04/20 1214    Clinical Impression Statement Pt arrived with Lt hamstring pain and cramping since last visit.  PT found TPs in lateral hamstring and performed DN with good twitch and release.  Heat and massage used for soreness.  Pt is no longer experiencing any daytime or nighttime leakage.  She is sleeping longer through the night but has signif urge on waking and rushes to the toilet.  She  experiences improved voiding with less need to strain since last visit's fascial release around urethra.  PT introduced co-contractions in TrA and PF in anti-gravity position with need for VCs and TCs to avoid upper abdominal recruitment.  Pt with min/mod success end of session with addition of ball squeeze in SL.  She is unable to maintain PF contraction  with sit to stand and to find contraction in standing.  PT updated HEP for SL neuro re-ed from today.  PT using breath cycles for coordination of recruitment of target muscles.  Pt has met all STGs and will need ongoing skilled progression for strength and coordination of deep core for imroved function and urge control.    Comorbidities GERD, IBS    PT Frequency 1x / week    PT Duration 12 weeks    PT Treatment/Interventions ADLs/Self Care Home Management;Biofeedback;Electrical Stimulation;Neuromuscular re-education;Therapeutic exercise;Therapeutic activities;Functional mobility training;Patient/family education;Joint Manipulations;Spinal Manipulations;Dry needling;Passive range of motion    PT Next Visit Plan ERO next time, f/u on Lt hamstring DN, review SL TrA/PF with ball squeeze, try quadruped isom of TrA, try wall plank, supine feet on ball roll in/out, artic bridges, dead bug isom    PT Home Exercise Plan double void, urge drill, Kegels 10x5 sec + 10 quick flicks, timed void every 60 min, count [place], bladder irritants Access Code: [email or web address]    Recommended Other Services biofeedback when ready for against gravity transition for PF strength    Consulted and Agree with Plan of Care Patient           Patient will benefit from skilled therapeutic intervention in order to improve the following deficits and impairments:     Visit Diagnosis: Muscle weakness (generalized)  Other lack of coordination     Problem List Patient Active Problem List   Diagnosis Date Noted  . Nexplanon in place 10/30/2019  . Mixed stress and urge urinary incontinence 10/30/2019  . GERD (gastroesophageal reflux disease) 01/22/2014  . Migraine without aura 11/20/2013  . Insomnia 11/20/2013  . Anxiety 11/20/2013  . Right ovarian cyst 10/23/2013  . Well woman exam 10/23/2013    Baruch Merl, PT 02/04/20 12:35 PM    [facility] [address] [place], [place], [address] Phone: [phone]   Fax:  [phone]  Name: Stephanie Pratt MRN: [id number] Date of Birth: [birth year]

## 2020-02-04 NOTE — Patient Instructions (Signed)
Access Code: [email or web address] URL: [email or web address] Date: 02/04/2020 Prepared by: Loistine Simas Fransico Sciandra  Exercises Sidelying Transversus Abdominis Bracing - 1 x daily - 7 x weekly - 2 sets - 10 reps

## 2020-02-14 ENCOUNTER — Ambulatory Visit: Payer: Medicaid Other | Admitting: Physical Therapy

## 2020-02-14 ENCOUNTER — Encounter: Payer: Self-pay | Admitting: Physical Therapy

## 2020-02-14 ENCOUNTER — Other Ambulatory Visit: Payer: Self-pay

## 2020-02-14 DIAGNOSIS — M6281 Muscle weakness (generalized): Secondary | ICD-10-CM | POA: Diagnosis not present

## 2020-02-14 DIAGNOSIS — R278 Other lack of coordination: Secondary | ICD-10-CM

## 2020-02-14 NOTE — Therapy (Signed)
[facility] [address] [place], [place], [address] Phone: [phone]   Fax:  [phone]  Physical Therapy Treatment  Patient Details  Name: Stephanie Pratt MRN: [id number] Date of Birth: Feb 20, 1991 Referring Provider (PT): Elvera Maria, CNM   Encounter Date: 02/14/2020   PT End of Session - 02/14/20 1327    Visit Number 6    Number of Visits 12    Date for PT Re-Evaluation 03/05/20    Authorization Type Medicaid [employer]    Authorization Time Period 01/22/20 to 04/22/19    Authorization - Visit Number 4    Authorization - Number of Visits 12    PT Start Time 0881    PT Stop Time 1355    PT Time Calculation (min) 38 min    Activity Tolerance Patient tolerated treatment well    Behavior During Therapy [facility] for tasks assessed/performed           Past Medical History:  Diagnosis Date  . Abnormal Pap smear of cervix   . IBS (irritable bowel syndrome)     Past Surgical History:  Procedure Laterality Date  . LAPAROSCOPIC APPENDECTOMY    . WISDOM TOOTH EXTRACTION      There were no vitals filed for this visit.   Subjective Assessment - 02/14/20 1326    Subjective I sneezed all day yesterday without any leakage.  Rising 2x/night to void but not as urgent.    Pertinent History GERD, IBS    Diagnostic tests xray - L5/S1 DDD    Patient Stated Goals gain control over urine    Currently in Pain? No/denies              [facility] PT Assessment - 02/14/20 0001      Assessment   Medical Diagnosis N39.46 (ICD-10-CM) - Mixed stress and urge urinary incontinence    Referring Provider (PT) Leftwich-Kirby, Kathie Dike, CNM    Onset Date/Surgical Date --   9 years ago, worsening   Next MD Visit no    Prior Therapy no                         [facility] Adult PT Treatment/Exercise - 02/14/20 0001      Self-Care   Self-Care Other Self-Care Comments    Other Self-Care Comments  bowel log to fill  out and take to MD appointment      Neuro Re-ed    Neuro Re-ed Details  biofeedback focused on posterior pelvic bowl: supine 17-18, SL below 5, sitting 2-3 with feet on squatty potty, PT cued barrel breathing and bulging in supine to attempt to further relax PF, music set to play at varied threshholds      Lumbar Exercises: Aerobic   Recumbent Bike L2x8' PT present to discuss goals and progress                    PT Short Term Goals - 01/22/20 1455      PT SHORT TERM GOAL #1   Title Pt will be ind with behavioral strategies for double void, urge drill, bladder irritant avoidance/moderation for improved control and empytying with voiding.    Baseline urge drill, double void    Status Achieved      PT SHORT TERM GOAL #2   Title Pt will be able to perform timed voiding every 60 min without leakage.    Baseline can delay 3-4 hours  Status Achieved      PT SHORT TERM GOAL #3   Title Pt will be able to perform 10 quick flicks without fatigue for improved urge control.    Baseline 10    Status Achieved             PT Long Term Goals - 02/14/20 1328      PT LONG TERM GOAL #1   Title Pt will reduce leakage to no more than 1 episode or less per day using techniques from HEP and behavioral strategies    Status Achieved      PT LONG TERM GOAL #2   Title Pt will be able to demo proper toileting technique for defecation and report greater ease with this.    Status Partially Met      PT LONG TERM GOAL #3   Title Pt will be able to delay voiding with timed voiding of at least 90 min between voids throughout the day without leakage.    Status Achieved      PT LONG TERM GOAL #4   Title Pt will achieve PERFECT strength score of at least 4/6/7//8 to demo improved pelvic floor strength and edurance.    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     Status Achieved      PT LONG TERM GOAL #6   Title Pt will report reduced urge by at least 50% with transition from bed to toilet on waking after longer stretch of sleep.    Status Achieved      PT LONG TERM GOAL #7   Title Pt will be able to demo proper core canister recruitment with dynamic anti-gravity functional tasks such as sit to stand, squat, lift and carry.    Status Deferred                 Plan - 02/14/20 1505    Clinical Impression Statement Pt has met or partially met all LTGs.  Pt reports much improved stretches between voids during night and day.  She has improved urge on waking in AM by >50% and is no longer straining to urinate.  She no longer leaks with sneezing.  She has ongoing constipation with recent history of rectal bleeding and past history of hemmoroidectomy which she wasn't sure was successful.  She has plans to see MD in 2 weeks to discuss these symptoms.  PT felt she should see the MD given the rectal bleeding before proceeding with any PT for this.  Pt understands if medically cleared and recommended, she can return with a new order for consitpation should the MD recommend pelvic PT for this.  PT did perform biofeedback today and discovered normal resting tone in both sidelying and sitting.  Pt is also able to bulge properly.  She does use Miralax and otherwise has no signal to have a BM and gets bloated and uncomfortable.  PT gave her a 3 day bowel log and instructed in how to fill out so she can present this to MD if helpful to assess diet, liquid intake and bowel patterns.  D/C to HEP for now.    Comorbidities GERD, IBS    PT Frequency 1x / week    PT Duration 12 weeks    PT Treatment/Interventions ADLs/Self Care Home Management;Biofeedback;Electrical Stimulation;Neuromuscular re-education;Therapeutic exercise;Therapeutic activities;Functional mobility training;Patient/family education;Joint  Manipulations;Spinal Manipulations;Dry needling;Passive range of  motion    PT Next Visit Plan d/c to HEP    PT Home Exercise Plan double void, urge drill, Kegels 10x5 sec + 10 quick flicks, timed void every 60 min, count [place], bladder irritants Access Code: [email or web address]    Consulted and Agree with Plan of Care Patient           Patient will benefit from skilled therapeutic intervention in order to improve the following deficits and impairments:     Visit Diagnosis: Muscle weakness (generalized)  Other lack of coordination     Problem List Patient Active Problem List   Diagnosis Date Noted  . Nexplanon in place 10/30/2019  . Mixed stress and urge urinary incontinence 10/30/2019  . GERD (gastroesophageal reflux disease) 01/22/2014  . Migraine without aura 11/20/2013  . Insomnia 11/20/2013  . Anxiety 11/20/2013  . Right ovarian cyst 10/23/2013  . Well woman exam 10/23/2013    Venetia Night E [place] 02/14/2020, 3:12 PM  PHYSICAL THERAPY DISCHARGE SUMMARY  Visits from Start of Care: 6  Current functional level related to goals / functional outcomes: See above   Remaining deficits: See above   Education / Equipment: HEP Plan: Patient agrees to discharge.  Patient goals were met. Patient is being discharged due to meeting the stated rehab goals.  ?????         Baruch Merl, PT 02/14/20 3:13 PM   [facility] [address] [place], [place], [address] Phone: [phone]   Fax:  [phone]  Name: Oneta Sigman MRN: [id number] Date of Birth: 1990/05/15

## 2020-03-12 ENCOUNTER — Encounter: Payer: Medicaid Other | Admitting: Physical Therapy

## 2020-03-17 ENCOUNTER — Encounter: Payer: Medicaid Other | Admitting: Physical Therapy

## 2020-03-24 ENCOUNTER — Encounter: Payer: Medicaid Other | Admitting: Physical Therapy

## 2020-03-31 ENCOUNTER — Encounter: Payer: Medicaid Other | Admitting: Physical Therapy

## 2020-04-08 ENCOUNTER — Encounter: Payer: Medicaid Other | Admitting: Physical Therapy

## 2020-04-14 ENCOUNTER — Encounter: Payer: Medicaid Other | Admitting: Physical Therapy

## 2020-04-21 ENCOUNTER — Encounter: Payer: Medicaid Other | Admitting: Physical Therapy

## 2020-11-11 ENCOUNTER — Other Ambulatory Visit: Payer: Self-pay

## 2020-11-11 ENCOUNTER — Ambulatory Visit: Payer: Medicaid Other | Admitting: Obstetrics and Gynecology

## 2020-11-11 ENCOUNTER — Encounter: Payer: Self-pay | Admitting: Obstetrics and Gynecology

## 2020-11-11 ENCOUNTER — Other Ambulatory Visit (HOSPITAL_COMMUNITY)
Admission: RE | Admit: 2020-11-11 | Discharge: 2020-11-11 | Disposition: A | Discharge status: Home / Self Care | Payer: Medicaid Other | Source: Ambulatory Visit | Attending: Obstetrics and Gynecology | Admitting: Obstetrics and Gynecology

## 2020-11-11 VITALS — BP 112/66 | HR 72 | Resp 16 | Ht 62.0 in | Wt 136.0 lb

## 2020-11-11 DIAGNOSIS — Z01419 Encounter for gynecological examination (general) (routine) without abnormal findings: Secondary | ICD-10-CM | POA: Diagnosis present

## 2020-11-11 DIAGNOSIS — Z3046 Encounter for surveillance of implantable subdermal contraceptive: Secondary | ICD-10-CM | POA: Diagnosis not present

## 2020-11-11 NOTE — Progress Notes (Signed)
GYNECOLOGY ANNUAL PREVENTATIVE CARE ENCOUNTER NOTE  History:     Stephanie Pratt is a 30 y.o. G44P1001 female here for a routine annual gynecologic exam.  Current complaints: None, desires pregnancy, requests nexplanon removal. .   Denies abnormal vaginal bleeding, discharge, pelvic pain, problems with intercourse or other gynecologic concerns.    Gynecologic History No LMP recorded. Patient has had an implant. Contraception: Nexplanon Last Pap: abnormal Result CIN 2/CIN 3 Last Mammogram: NA   Obstetric History OB History  Gravida Para Term Preterm AB Living  1 1 1     1   SAB IAB Ectopic Multiple Live Births               # Outcome Date GA Lbr Len/2nd Weight Sex Delivery Anes PTL Lv  1 Term 04/30/09   6 lb 7 oz (2.92 kg) F Vag-Spont EPI      Past Medical History:  Diagnosis Date   Abnormal Pap smear of cervix    IBS (irritable bowel syndrome)     Past Surgical History:  Procedure Laterality Date   LAPAROSCOPIC APPENDECTOMY     WISDOM TOOTH EXTRACTION      Current Outpatient Medications on File Prior to Visit  Medication Sig Dispense Refill   Etonogestrel (NEXPLANON [place]) Inject into the skin.     Multiple Vitamin (MULTIVITAMIN) tablet Take 1 tablet by mouth daily.     albuterol (VENTOLIN HFA) 108 (90 Base) MCG/ACT inhaler Inhale into the lungs. (Patient not taking: Reported on 11/11/2020)     No current facility-administered medications on file prior to visit.    No Known Allergies  Social History:  reports that she has quit smoking. Her smoking use included cigarettes. She has a 0.60 pack-year smoking history. She has never used smokeless tobacco. She reports current alcohol use. She reports that she does not use drugs.  Family History  Problem Relation Age of Onset   Breast cancer Mother    Diabetes Father    Heart disease Father    Hypertension Father    COPD Father    Congestive Heart Failure Father    Vascular Disease Father    Cancer Maternal  Grandmother        breast    The following portions of the patient's history were reviewed and updated as appropriate: allergies, current medications, past family history, past medical history, past social history, past surgical history and problem list.  Review of Systems Pertinent items noted in HPI and remainder of comprehensive ROS otherwise negative.  Physical Exam:  BP 112/66   Pulse 72   Resp 16   Ht 5\' 2"  (1.575 m)   Wt 136 lb (61.7 kg)   BMI 24.87 kg/m  CONSTITUTIONAL: Well-developed, well-nourished female in no acute distress.  HENT:  Normocephalic, atraumatic, External right and left ear normal.  EYES: Conjunctivae and EOM are normal. Pupils are equal, round, and reactive to light. No scleral icterus.  NECK: Normal range of motion, supple, no masses.  Normal thyroid.  SKIN: Skin is warm and dry. No rash noted. Not diaphoretic. No erythema. No pallor. MUSCULOSKELETAL: Normal range of motion. No tenderness.  No cyanosis, clubbing, or edema. NEUROLOGIC: Alert and oriented to person, place, and time. Normal reflexes, muscle tone coordination.  PSYCHIATRIC: Normal mood and affect. Normal behavior. Normal judgment and thought content. CARDIOVASCULAR: Normal heart rate noted, regular rhythm RESPIRATORY: Clear to auscultation bilaterally. Effort and breath sounds normal, no problems with respiration noted. BREASTS: Symmetric in size. No masses,  tenderness, skin changes, nipple drainage, or lymphadenopathy bilaterally. Performed in the presence of a chaperone. ABDOMEN: Soft, no distention noted.  No tenderness, rebound or guarding.  PELVIC: Normal appearing external genitalia and urethral meatus; normal appearing vaginal mucosa and cervix.  No abnormal vaginal discharge noted.  Pap smear obtained.  Normal uterine size, no other palpable masses, no uterine or adnexal tenderness.  Performed in the presence of a chaperone.   Assessment and Plan:   1. Well woman exam with routine  gynecological exam  - Cytology - PAP( [place])  2. Nexplanon removal  Start taking prenatal vitamins    Will follow up results of pap smear and manage accordingly. Routine preventative health maintenance measures emphasized. Please refer to After Visit Summary for other counseling recommendations.     Stephanie Pratt, Harolyn Rutherford, NP [facility] [employer], [facility]        GYNECOLOGY OFFICE PROCEDURE NOTE  Stephanie Pratt is a 72 y.o. G1P1001 here for Nexplanon removal.   Nexplanon Removal Patient identified, informed consent performed, consent signed.   Appropriate time out taken. Nexplanon site identified.  Area prepped in usual sterile fashon. One ml of 1% lidocaine was used to anesthetize the area at the distal end of the implant. A small stab incision was made right beside the implant on the distal portion.  The Nexplanon rod was grasped using hemostats and removed without difficulty.  There was minimal blood loss. There were no complications.  3 ml of 1% lidocaine was injected around the incision for post-procedure analgesia.  Steri-strips were applied over the small incision.  A pressure bandage was applied to reduce any bruising.  The patient tolerated the procedure well and was given post procedure instructions.  Patient is planning to become pregnant.    Alvey Brockel, Harolyn Rutherford, NP [facility] [employer], [facility]

## 2020-11-14 LAB — CYTOLOGY - PAP
Comment: NEGATIVE
Diagnosis: NEGATIVE
High risk HPV: NEGATIVE

## 2021-03-10 ENCOUNTER — Telehealth: Payer: Medicaid Other | Admitting: Obstetrics and Gynecology

## 2021-03-16 ENCOUNTER — Telehealth: Payer: Self-pay

## 2021-03-16 ENCOUNTER — Telehealth: Payer: Medicaid Other | Admitting: Obstetrics & Gynecology

## 2021-03-16 NOTE — Telephone Encounter (Signed)
Pt had virtual appt scheduled for today at 3:50pm. I called pt to attempt to begin virtual appt. Pt did not answer. VM left asking pt to call office.

## 2021-03-30 IMAGING — US US PELVIS COMPLETE WITH TRANSVAGINAL
1 series · 15 of 25 positions shown · non-contrast
Comparison: None

CLINICAL DATA: Pelvic pain.

EXAM:
TRANSABDOMINAL AND TRANSVAGINAL ULTRASOUND OF PELVIS
TECHNIQUE: Both transabdominal and transvaginal ultrasound examinations of the
pelvis were performed. Transabdominal technique was performed for
global imaging of the pelvis including uterus, ovaries, adnexal
regions, and pelvic cul-de-sac. It was necessary to proceed with
endovaginal exam following the transabdominal exam to visualize the
endometrium and ovaries.

[Series 1: us pelvis complete with transvaginal · 15 of 62 slices shown]
[im 1/62]
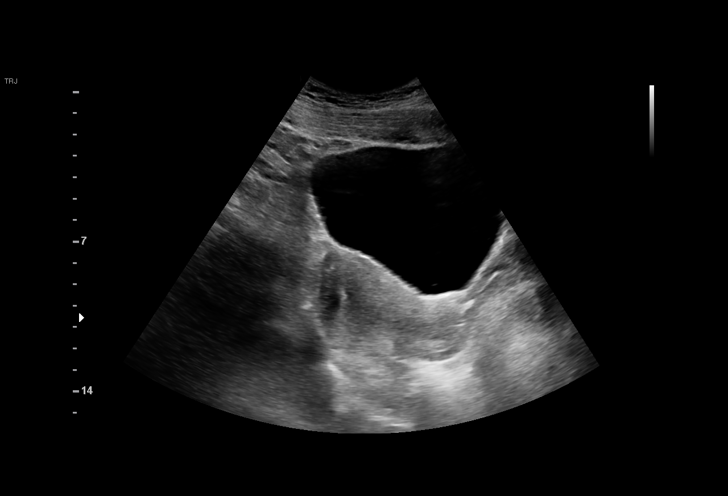
[im 6/62]
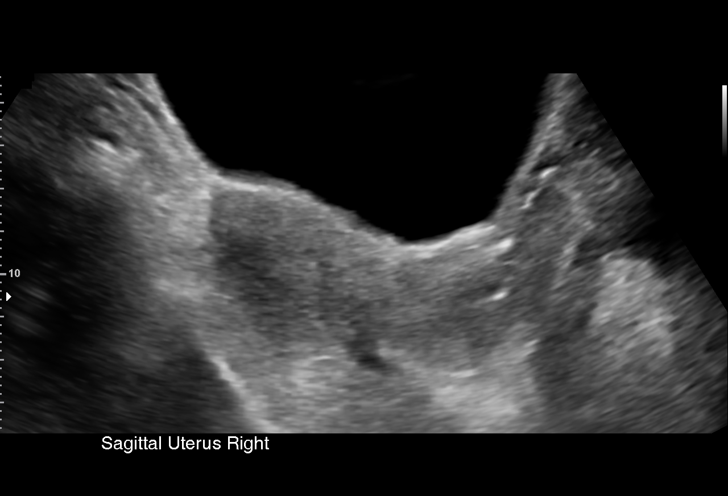
[im 11/62]
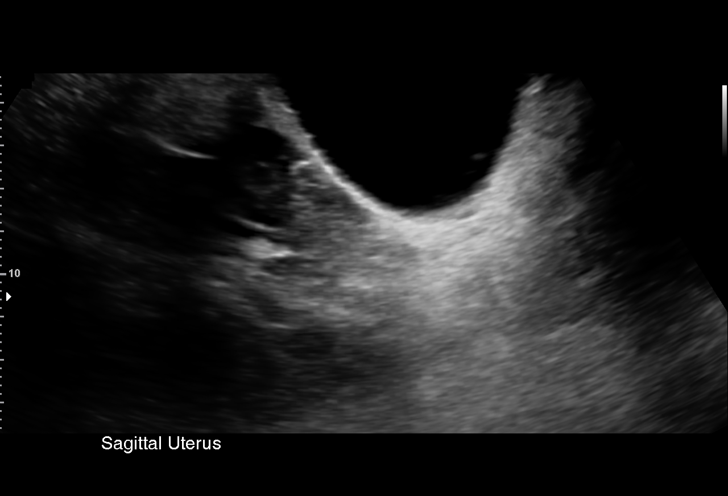
[im 13/62]
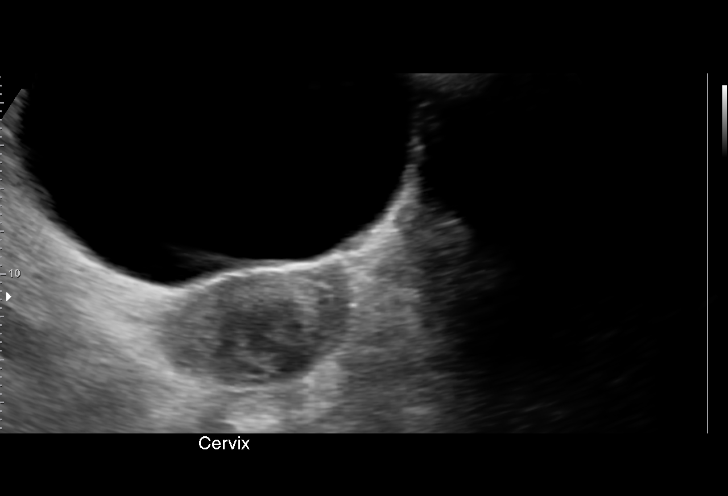
[im 18/62]
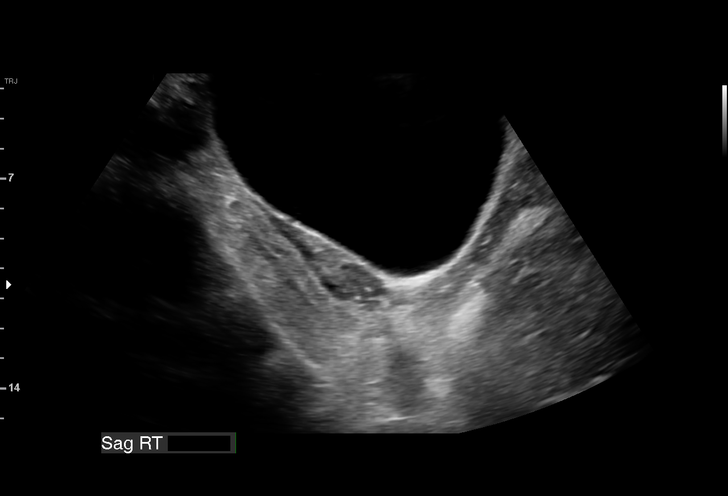
[im 23/62]
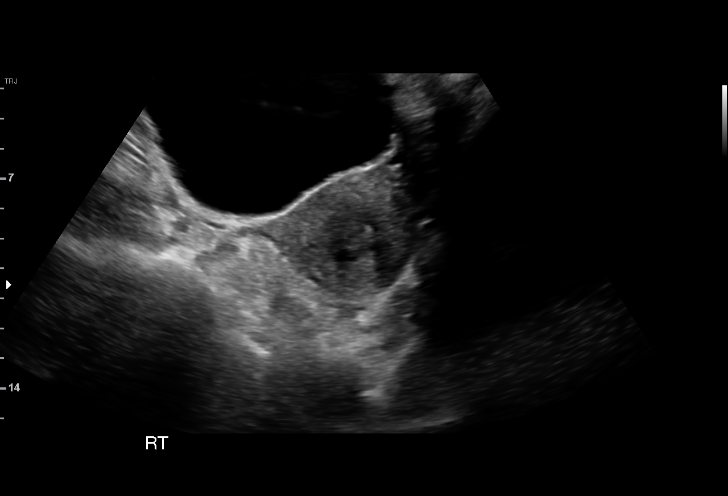
[im 26/62]
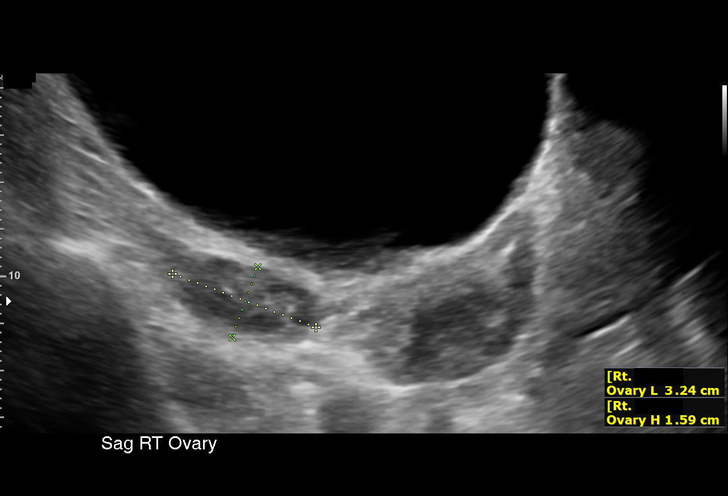
[im 31/62]
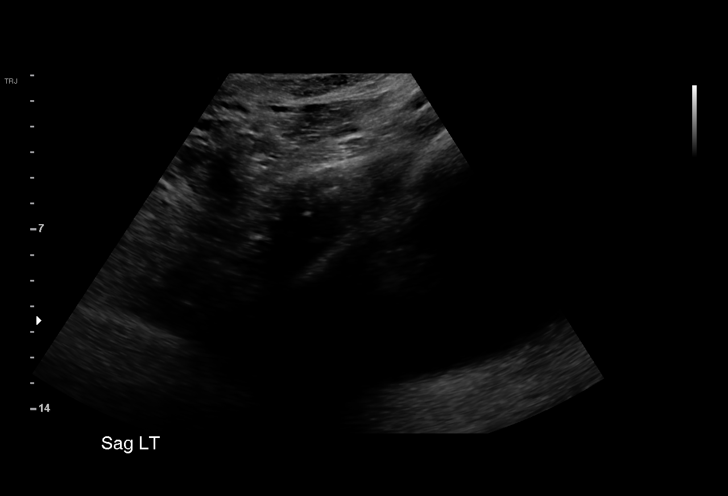
[im 36/62]
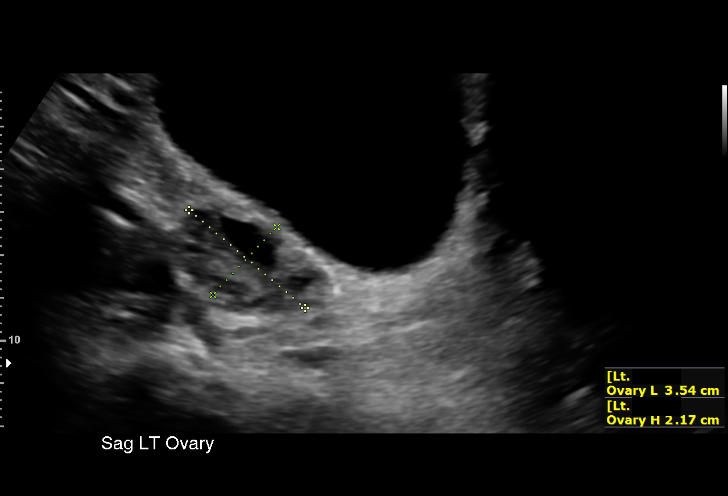
[im 39/62]
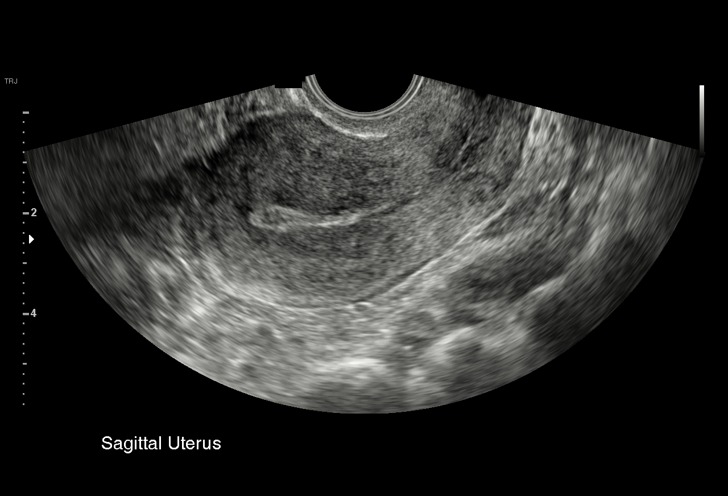
[im 44/62]
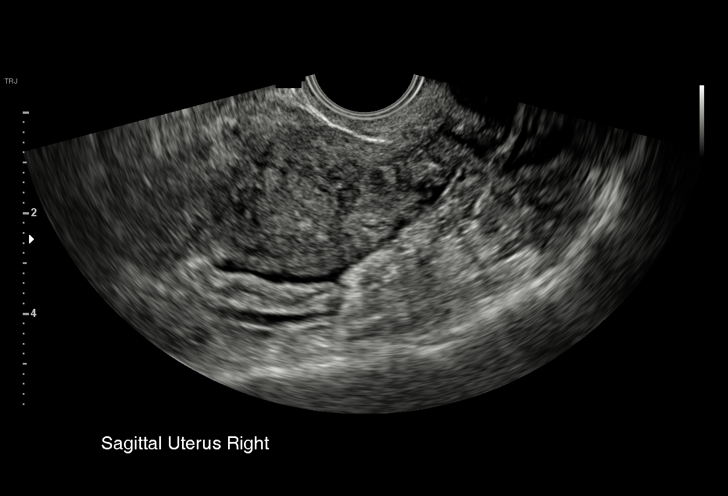
[im 49/62]
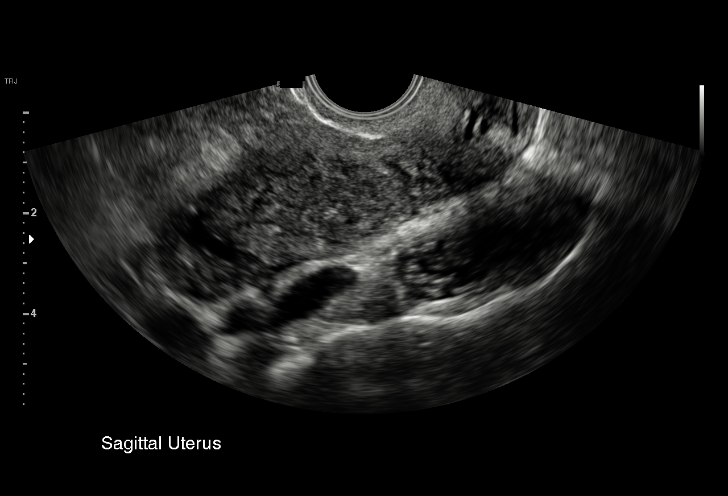
[im 51/62]
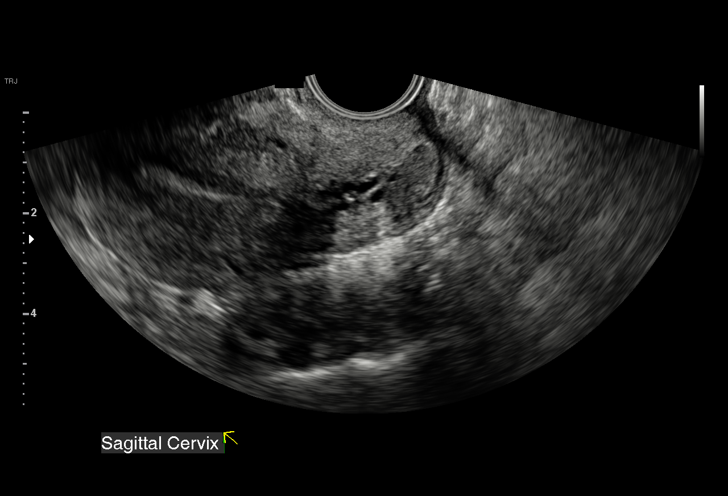
[im 56/62]
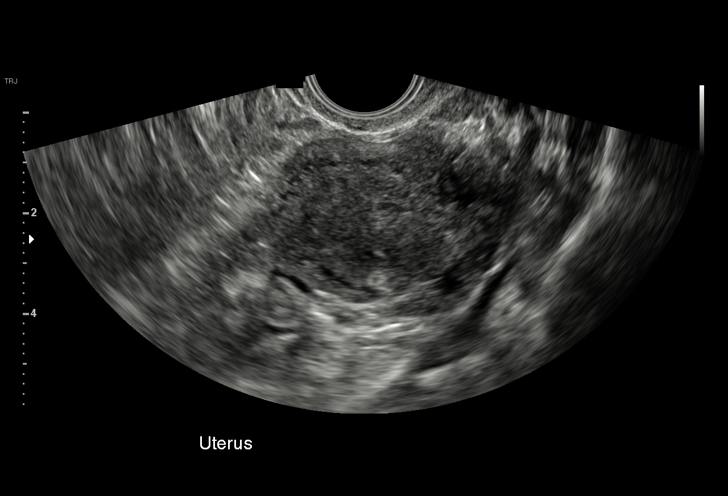
[im 62/62]
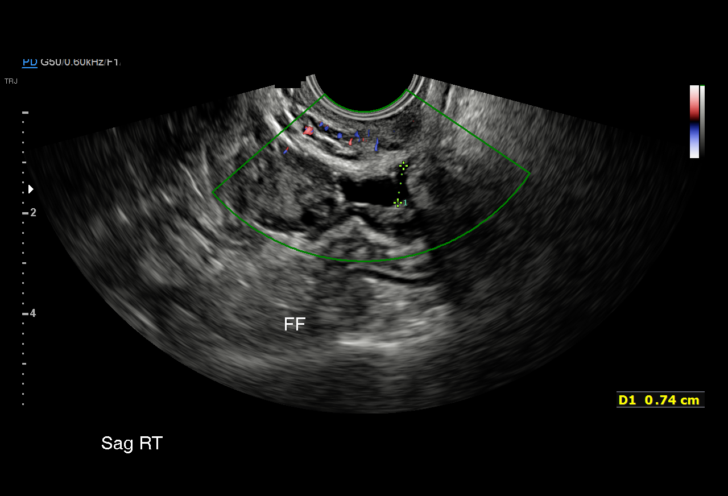

[15 of 25 positions shown; findings below may reference images not displayed]

FINDINGS: Uterus

Measurements: 7.7 x 4.1 x 4.7 cm = volume: 77 mL. No fibroids or
other mass visualized.

Endometrium

Thickness: 5 mm.  No focal abnormality visualized.

Right ovary

Measurements: 3.0 x 1.5 x 2.5 cm = volume: 5.9 mL. Normal
appearance/no adnexal mass.

Left ovary

Measurements: 3.6 x 2.0 x 3.0 cm = volume: 11.0 mL. Normal
appearance/no adnexal mass.

Other findings

No abnormal free fluid.
IMPRESSION: Negative. No pelvic mass or other significant abnormality
identified.

## 2021-09-07 ENCOUNTER — Encounter: Payer: Self-pay | Admitting: Obstetrics & Gynecology

## 2021-09-07 ENCOUNTER — Other Ambulatory Visit (HOSPITAL_COMMUNITY)
Admission: RE | Admit: 2021-09-07 | Discharge: 2021-09-07 | Disposition: A | Discharge status: Home / Self Care | Payer: Medicaid Other | Source: Ambulatory Visit | Attending: Obstetrics & Gynecology | Admitting: Obstetrics & Gynecology

## 2021-09-07 ENCOUNTER — Ambulatory Visit (INDEPENDENT_AMBULATORY_CARE_PROVIDER_SITE_OTHER): Payer: Medicaid Other | Admitting: Obstetrics & Gynecology

## 2021-09-07 DIAGNOSIS — Z9889 Other specified postprocedural states: Secondary | ICD-10-CM | POA: Diagnosis not present

## 2021-09-07 DIAGNOSIS — O3440 Maternal care for other abnormalities of cervix, unspecified trimester: Secondary | ICD-10-CM | POA: Diagnosis not present

## 2021-09-07 DIAGNOSIS — Z3480 Encounter for supervision of other normal pregnancy, unspecified trimester: Secondary | ICD-10-CM | POA: Diagnosis present

## 2021-09-07 DIAGNOSIS — Z803 Family history of malignant neoplasm of breast: Secondary | ICD-10-CM | POA: Diagnosis not present

## 2021-09-07 NOTE — Progress Notes (Signed)
Bedside U/S shows single IUP with FHT of 151 BPM  CRL measures 5.63mm  GA [redacted]w[redacted]d.  Pt did say she had some spotting around 4/4-5.  Her CRL is closer to that in dates.

## 2021-09-07 NOTE — Progress Notes (Signed)
Subjective:    Stephanie Pratt is a G2P1001 [redacted]w[redacted]d being seen today for her first obstetrical visit.  Her obstetrical history is significant for  history  . Patient does intend to breast feed. Pregnancy history fully reviewed.  Patient reports  mild nausea and some vomiting earlier this week; better with eating every 2-3 hours .   HISTORY: OB History  Gravida Para Term Preterm AB Living  2 1 1     1   SAB IAB Ectopic Multiple Live Births               # Outcome Date GA Lbr Len/2nd Weight Sex Delivery Anes PTL Lv  2 Current           1 Term 04/30/09   6 lb 7 oz (2.92 kg) F Vag-Spont EPI     Past Medical History:  Diagnosis Date   Abnormal Pap smear of cervix    IBS (irritable bowel syndrome)    Past Surgical History:  Procedure Laterality Date   LAPAROSCOPIC APPENDECTOMY     WISDOM TOOTH EXTRACTION     Family History  Problem Relation Age of Onset   Breast cancer Mother    Diabetes Father    Heart disease Father    Hypertension Father    COPD Father    Congestive Heart Failure Father    Vascular Disease Father    Cancer Maternal Grandmother        breast     Exam   Vitals:   09/07/21 1458  BP: 132/70  Pulse: 61  Weight: 131 lb (59.4 kg)     Uterus:     Pelvic Exam:    Perineum: Normal Perineum   Vulva: normal   Vagina:  normal mucosa, normal discharge   pH: N/a   Cervix: no lesions   Adnexa: no mass, fullness, tenderness   Bony Pelvis: average  System: Breast:  normal appearance, no masses or tenderness   Skin: normal coloration and turgor, no rashes    Neurologic: oriented   Extremities: no deformities   HEENT sclera clear, anicteric, oropharynx clear, no lesions, neck supple with midline trachea, thyroid without masses, and trachea midline   Mouth/Teeth mucous membranes moist, pharynx normal without lesions and dental hygiene good   Neck supple and no masses   Cardiovascular: regular rate and rhythm   Respiratory:  appears well, vitals normal,  no respiratory distress, acyanotic, normal RR, neck free of mass or lymphadenopathy, chest clear, no wheezing, crepitations, rhonchi, normal symmetric air entry   Abdomen: soft, non-tender; bowel sounds normal; no masses,  no organomegaly   Urinary: urethral meatus normal      Assessment:    Pregnancy: G2P1001 Patient Active Problem List   Diagnosis Date Noted   Supervision of other normal pregnancy, antepartum 09/07/2021   Well woman exam with routine gynecological exam 11/11/2020   Nexplanon removal 11/11/2020   Nexplanon in place 10/30/2019   Mixed stress and urge urinary incontinence 10/30/2019   GERD (gastroesophageal reflux disease) 01/22/2014   Migraine without aura 11/20/2013   Insomnia 11/20/2013   Anxiety 11/20/2013   Right ovarian cyst 10/23/2013   Well woman exam 10/23/2013        Plan:     Initial labs drawn. Prenatal vitamins. Problem list reviewed and updated. Genetic Screening discussed--Panorama and AFP to be down at 12 and 16 weeks respectively  Ultrasound discussed; fetal survey:  18-20 weeks .  History of LEEP--Will look at cervical length at anatomy exam and  offer digital exam at 16 weeks when here for AFP  Genetic counseling post pregnancy for clinically significant breast cancer gene.  Pt does not want meds at this time for nausea; will send my chart message if worsens  BP cuff given.   Follow up in 5 weeks.    Elsie Lincoln 09/07/2021

## 2021-09-08 LAB — GC/CHLAMYDIA PROBE AMP (~~LOC~~) NOT AT ARMC
Chlamydia: NEGATIVE
Comment: NEGATIVE
Comment: NORMAL
Neisseria Gonorrhea: NEGATIVE

## 2021-09-08 LAB — OBSTETRIC PANEL
Absolute Monocytes: 793 cells/uL (ref 200–950)
Antibody Screen: NOT DETECTED
Basophils Absolute: 52 cells/uL (ref 0–200)
Basophils Relative: 0.4 %
Eosinophils Absolute: 169 cells/uL (ref 15–500)
Eosinophils Relative: 1.3 %
HCT: 41.6 % (ref 35.0–45.0)
Hemoglobin: 14.2 g/dL (ref 11.7–15.5)
Hepatitis B Surface Ag: NONREACTIVE
Lymphs Abs: 3861 cells/uL (ref 850–3900)
MCH: 32.2 pg (ref 27.0–33.0)
MCHC: 34.1 g/dL (ref 32.0–36.0)
MCV: 94.3 fL (ref 80.0–100.0)
MPV: 12.2 fL (ref 7.5–12.5)
Monocytes Relative: 6.1 %
Neutro Abs: 8125 cells/uL — ABNORMAL HIGH (ref 1500–7800)
Neutrophils Relative %: 62.5 %
Platelets: 279 10*3/uL (ref 140–400)
RBC: 4.41 10*6/uL (ref 3.80–5.10)
RDW: 12.8 % (ref 11.0–15.0)
RPR Ser Ql: NONREACTIVE
Rubella: 1.62 Index
Total Lymphocyte: 29.7 %
WBC: 13 10*3/uL — ABNORMAL HIGH (ref 3.8–10.8)

## 2021-09-08 LAB — HEPATITIS C ANTIBODY
Hepatitis C Ab: NONREACTIVE
SIGNAL TO CUT-OFF: 0.15 (ref <1.00)

## 2021-09-08 LAB — HIV ANTIBODY (ROUTINE TESTING W REFLEX): HIV 1&2 Ab, 4th Generation: NONREACTIVE

## 2021-09-09 LAB — URINE CULTURE, OB REFLEX: Organism ID, Bacteria: NO GROWTH

## 2021-09-09 LAB — CULTURE, OB URINE

## 2021-10-05 ENCOUNTER — Ambulatory Visit (INDEPENDENT_AMBULATORY_CARE_PROVIDER_SITE_OTHER): Payer: Medicaid Other | Admitting: Obstetrics & Gynecology

## 2021-10-05 VITALS — BP 125/77 | HR 84 | Wt 138.0 lb

## 2021-10-05 DIAGNOSIS — Z3480 Encounter for supervision of other normal pregnancy, unspecified trimester: Secondary | ICD-10-CM

## 2021-10-05 DIAGNOSIS — O3440 Maternal care for other abnormalities of cervix, unspecified trimester: Secondary | ICD-10-CM

## 2021-10-05 DIAGNOSIS — Z3A10 10 weeks gestation of pregnancy: Secondary | ICD-10-CM

## 2021-10-05 DIAGNOSIS — Z9889 Other specified postprocedural states: Secondary | ICD-10-CM

## 2021-10-05 NOTE — Progress Notes (Unsigned)
   PRENATAL VISIT NOTE  Subjective:  Stephanie Pratt is a 31 y.o. G2P1001 at [redacted]w[redacted]d being seen today for ongoing prenatal care.  She is currently monitored for the following issues for this low-risk pregnancy and has Migraine without aura; GERD (gastroesophageal reflux disease); Mixed stress and urge urinary incontinence; Supervision of other normal pregnancy, antepartum; Family history of breast cancer in mother; and History of cervical LEEP biopsy affecting care of mother, antepartum on their problem list.  Patient reports nausea and a few times of emesis .   . Vag. Bleeding: None.  Movement: Absent. Denies leaking of fluid.   The following portions of the patient's history were reviewed and updated as appropriate: allergies, current medications, past family history, past medical history, past social history, past surgical history and problem list.   Objective:   Vitals:   10/05/21 1542  BP: 125/77  Pulse: 84  Weight: 138 lb (62.6 kg)    Fetal Status: Fetal Heart Rate (bpm): 159   Movement: Absent     General:  Alert, oriented and cooperative. Patient is in no acute distress.  Skin: Skin is warm and dry. No rash noted.   Cardiovascular: Normal heart rate noted  Respiratory: Normal respiratory effort, no problems with respiration noted  Abdomen: Soft, gravid, appropriate for gestational age.        Pelvic: Cervical exam deferred        Extremities: Normal range of motion.  Edema: None  Mental Status: Normal mood and affect. Normal behavior. Normal judgment and thought content.   Assessment and Plan:  Pregnancy: G2P1001 at [redacted]w[redacted]d 1. Supervision of other normal pregnancy, antepartum Genetic testing ordered Anatomy US ordered.  Baby Rx crossing and BPs normal.  She is checking weekly.   2. History of cervical LEEP biopsy affecting care of mother, antepartum Cervical exam at next visit and check cervical length on anatomy US  Preterm labor symptoms and general obstetric  precautions including but not limited to vaginal bleeding, contractions, leaking of fluid and fetal movement were reviewed in detail with the patient. Please refer to After Visit Summary for other counseling recommendations.   No follow-ups on file.  Future Appointments  Date Time Provider Department Center  11/16/2021  1:10 PM Lesly Dukes, MD [facility]    Elsie Lincoln, MD

## 2021-10-27 ENCOUNTER — Encounter: Payer: Self-pay | Admitting: *Deleted

## 2021-11-10 ENCOUNTER — Other Ambulatory Visit (HOSPITAL_COMMUNITY)
Admission: RE | Admit: 2021-11-10 | Discharge: 2021-11-10 | Disposition: A | Discharge status: Home / Self Care | Payer: Medicaid Other | Source: Ambulatory Visit | Attending: Obstetrics and Gynecology | Admitting: Obstetrics and Gynecology

## 2021-11-10 ENCOUNTER — Ambulatory Visit (INDEPENDENT_AMBULATORY_CARE_PROVIDER_SITE_OTHER): Payer: Medicaid Other

## 2021-11-10 DIAGNOSIS — N898 Other specified noninflammatory disorders of vagina: Secondary | ICD-10-CM | POA: Insufficient documentation

## 2021-11-10 NOTE — Progress Notes (Signed)
Pt c/o vaginal discharge with an odor. Pt denies UTI symptoms. Self swab done and pt is aware we will contact with results.

## 2021-11-11 LAB — CERVICOVAGINAL ANCILLARY ONLY
Bacterial Vaginitis (gardnerella): NEGATIVE
Candida Glabrata: NEGATIVE
Candida Vaginitis: POSITIVE — AB
Comment: NEGATIVE
Comment: NEGATIVE
Comment: NEGATIVE

## 2021-11-12 ENCOUNTER — Telehealth: Payer: Self-pay | Admitting: *Deleted

## 2021-11-12 MED ORDER — TERCONAZOLE 0.4 % VA CREA: 1.0000 | TOPICAL_CREAM | Freq: Every day | VAGINAL | 0 refills | Status: DC

## 2021-11-12 NOTE — Telephone Encounter (Cosign Needed)
Pt called regarding her testing positive for yeast on Aptima.  I tried x 2 to reach pt with results without success.  RX for [employer] sent to her pharmacy.  My chart message sent to pt.

## 2021-11-16 ENCOUNTER — Ambulatory Visit: Payer: Medicaid Other | Admitting: Obstetrics & Gynecology

## 2021-11-25 ENCOUNTER — Encounter: Payer: Self-pay | Admitting: Obstetrics and Gynecology

## 2021-12-01 ENCOUNTER — Other Ambulatory Visit: Payer: Self-pay | Admitting: *Deleted

## 2021-12-01 ENCOUNTER — Other Ambulatory Visit: Payer: Self-pay | Admitting: Obstetrics & Gynecology

## 2021-12-01 ENCOUNTER — Ambulatory Visit: Payer: Medicaid Other | Attending: Obstetrics & Gynecology

## 2021-12-01 DIAGNOSIS — Z362 Encounter for other antenatal screening follow-up: Secondary | ICD-10-CM

## 2021-12-01 DIAGNOSIS — Z9889 Other specified postprocedural states: Secondary | ICD-10-CM

## 2021-12-01 DIAGNOSIS — Z3480 Encounter for supervision of other normal pregnancy, unspecified trimester: Secondary | ICD-10-CM | POA: Insufficient documentation

## 2021-12-01 DIAGNOSIS — Z3A18 18 weeks gestation of pregnancy: Secondary | ICD-10-CM | POA: Insufficient documentation

## 2021-12-01 DIAGNOSIS — O3442 Maternal care for other abnormalities of cervix, second trimester: Secondary | ICD-10-CM | POA: Diagnosis present

## 2021-12-01 DIAGNOSIS — O3440 Maternal care for other abnormalities of cervix, unspecified trimester: Secondary | ICD-10-CM

## 2021-12-07 ENCOUNTER — Encounter: Payer: Medicaid Other | Admitting: Obstetrics and Gynecology

## 2021-12-07 ENCOUNTER — Other Ambulatory Visit: Payer: Self-pay

## 2021-12-15 ENCOUNTER — Encounter: Payer: Medicaid Other | Admitting: Obstetrics and Gynecology

## 2021-12-19 NOTE — Addendum Note (Signed)
Addended by: Lesly Dukes on: 12/19/2021 09:44 AM   Modules accepted: Orders

## 2021-12-22 ENCOUNTER — Ambulatory Visit (INDEPENDENT_AMBULATORY_CARE_PROVIDER_SITE_OTHER): Payer: Medicaid Other

## 2021-12-22 VITALS — BP 130/78 | HR 87 | Wt 155.0 lb

## 2021-12-22 DIAGNOSIS — Z3A21 21 weeks gestation of pregnancy: Secondary | ICD-10-CM

## 2021-12-22 DIAGNOSIS — Z9889 Other specified postprocedural states: Secondary | ICD-10-CM

## 2021-12-22 DIAGNOSIS — O3440 Maternal care for other abnormalities of cervix, unspecified trimester: Secondary | ICD-10-CM

## 2021-12-22 DIAGNOSIS — Z3480 Encounter for supervision of other normal pregnancy, unspecified trimester: Secondary | ICD-10-CM

## 2021-12-22 NOTE — Progress Notes (Signed)
   PRENATAL VISIT NOTE  Subjective:  Stephanie Pratt is a 31 y.o. G2P1001 at [redacted]w[redacted]d being seen today for ongoing prenatal care.  She is currently monitored for the following issues for this low-risk pregnancy and has Migraine without aura; GERD (gastroesophageal reflux disease); Mixed stress and urge urinary incontinence; Supervision of other normal pregnancy, antepartum; Family history of breast cancer in mother; and History of cervical LEEP biopsy affecting care of mother, antepartum on their problem list.  Patient reports no complaints.  Contractions: Not present. Vag. Bleeding: None.  Movement: Present. Denies leaking of fluid.   The following portions of the patient's history were reviewed and updated as appropriate: allergies, current medications, past family history, past medical history, past social history, past surgical history and problem list.   Objective:   Vitals:   12/22/21 1404  BP: 130/78  Pulse: 87  Weight: 155 lb (70.3 kg)    Fetal Status: Fetal Heart Rate (bpm): 138 Fundal Height: 22 cm Movement: Present     General:  Alert, oriented and cooperative. Patient is in no acute distress.  Skin: Skin is warm and dry. No rash noted.   Cardiovascular: Normal heart rate noted  Respiratory: Normal respiratory effort, no problems with respiration noted  Abdomen: Soft, gravid, appropriate for gestational age.  Pain/Pressure: Absent     Pelvic: Cervical exam deferred        Extremities: Normal range of motion.  Edema: None  Mental Status: Normal mood and affect. Normal behavior. Normal judgment and thought content.   Assessment and Plan:  Pregnancy: G2P1001 at [redacted]w[redacted]d 1. Supervision of other normal pregnancy, antepartum - Routine OB. Doing well, no concerns - Anticipatory guidance for upcoming appointments provided  - Alpha fetoprotein, maternal  2. [redacted] weeks gestation of pregnancy - FH appropriate - Endorses active fetal movement  3. History of cervical LEEP biopsy  affecting care of mother, antepartum - Cervical length 3.2cm at last ultrasound   Preterm labor symptoms and general obstetric precautions including but not limited to vaginal bleeding, contractions, leaking of fluid and fetal movement were reviewed in detail with the patient. Please refer to After Visit Summary for other counseling recommendations.   Return in about 4 weeks (around 01/19/2022) for LOB.  Future Appointments  Date Time Provider Department Center  12/29/2021  3:15 PM [facility] NURSE [facility] [place]  12/29/2021  3:30 PM [facility] US3 [facility]  01/19/2022  2:50 PM Brand Males, CNM [facility]    Brand Males, CNM

## 2021-12-23 LAB — ALPHA FETOPROTEIN, MATERNAL
AFP MoM: 0.87
AFP, Serum: 59.1 ng/mL
Calc'd Gestational Age: 21.3 weeks
Maternal Wt: 155 [lb_av]
Risk for ONTD: 1
Twins-AFP: 1

## 2021-12-29 ENCOUNTER — Ambulatory Visit: Payer: Medicaid Other | Attending: Maternal & Fetal Medicine

## 2021-12-29 ENCOUNTER — Ambulatory Visit: Payer: Medicaid Other | Admitting: *Deleted

## 2021-12-29 VITALS — BP 119/64 | HR 72

## 2021-12-29 DIAGNOSIS — O3442 Maternal care for other abnormalities of cervix, second trimester: Secondary | ICD-10-CM | POA: Diagnosis not present

## 2021-12-29 DIAGNOSIS — Z3480 Encounter for supervision of other normal pregnancy, unspecified trimester: Secondary | ICD-10-CM | POA: Insufficient documentation

## 2021-12-29 DIAGNOSIS — Z9889 Other specified postprocedural states: Secondary | ICD-10-CM | POA: Diagnosis present

## 2021-12-29 DIAGNOSIS — O3440 Maternal care for other abnormalities of cervix, unspecified trimester: Secondary | ICD-10-CM | POA: Diagnosis present

## 2021-12-29 DIAGNOSIS — Z362 Encounter for other antenatal screening follow-up: Secondary | ICD-10-CM | POA: Diagnosis present

## 2021-12-29 DIAGNOSIS — Z3A22 22 weeks gestation of pregnancy: Secondary | ICD-10-CM | POA: Diagnosis not present

## 2022-01-19 ENCOUNTER — Ambulatory Visit (INDEPENDENT_AMBULATORY_CARE_PROVIDER_SITE_OTHER): Payer: Medicaid Other

## 2022-01-19 VITALS — BP 118/73 | HR 88 | Wt 161.0 lb

## 2022-01-19 DIAGNOSIS — Z9889 Other specified postprocedural states: Secondary | ICD-10-CM

## 2022-01-19 DIAGNOSIS — Z3480 Encounter for supervision of other normal pregnancy, unspecified trimester: Secondary | ICD-10-CM

## 2022-01-19 DIAGNOSIS — Z3482 Encounter for supervision of other normal pregnancy, second trimester: Secondary | ICD-10-CM

## 2022-01-19 DIAGNOSIS — O3442 Maternal care for other abnormalities of cervix, second trimester: Secondary | ICD-10-CM

## 2022-01-19 DIAGNOSIS — Z3A25 25 weeks gestation of pregnancy: Secondary | ICD-10-CM

## 2022-01-19 NOTE — Progress Notes (Signed)
Pt declined flu shot °

## 2022-01-19 NOTE — Progress Notes (Signed)
   PRENATAL VISIT NOTE  Subjective:  Stephanie Pratt is a 31 y.o. G2P1001 at [redacted]w[redacted]d being seen today for ongoing prenatal care.  She is currently monitored for the following issues for this low-risk pregnancy and has Migraine without aura; GERD (gastroesophageal reflux disease); Mixed stress and urge urinary incontinence; Supervision of other normal pregnancy, antepartum; Family history of breast cancer in mother; and History of cervical LEEP biopsy affecting care of mother, antepartum on their problem list.  Patient reports no complaints.  Contractions: Not present. Vag. Bleeding: None.  Movement: Present. Denies leaking of fluid.   The following portions of the patient's history were reviewed and updated as appropriate: allergies, current medications, past family history, past medical history, past social history, past surgical history and problem list.   Objective:   Vitals:   01/19/22 1436  BP: 118/73  Pulse: 88  Weight: 161 lb (73 kg)    Fetal Status: Fetal Heart Rate (bpm): 141 Fundal Height: 25 cm Movement: Present     General:  Alert, oriented and cooperative. Patient is in no acute distress.  Skin: Skin is warm and dry. No rash noted.   Cardiovascular: Normal heart rate noted  Respiratory: Normal respiratory effort, no problems with respiration noted  Abdomen: Soft, gravid, appropriate for gestational age.  Pain/Pressure: Absent     Pelvic: Cervical exam deferred        Extremities: Normal range of motion.  Edema: None  Mental Status: Normal mood and affect. Normal behavior. Normal judgment and thought content.   Assessment and Plan:  Pregnancy: G2P1001 at [redacted]w[redacted]d 1. Supervision of other normal pregnancy, antepartum - Routine OB. Doing well, no concerns - Anticipatory guidance for upcoming appointments provided - GTT and labs next visit  2. [redacted] weeks gestation of pregnancy - FH appropriate - Endorses active fetal movement  3. History of cervical LEEP biopsy affecting  care of mother, antepartum   Preterm labor symptoms and general obstetric precautions including but not limited to vaginal bleeding, contractions, leaking of fluid and fetal movement were reviewed in detail with the patient. Please refer to After Visit Summary for other counseling recommendations.   Return in about 3 weeks (around 02/09/2022) for LOB, GTT, and labs.  Future Appointments  Date Time Provider [place]  02/09/2022  8:10 AM Rasch, Artist Pais, NP [facility]    Renee Harder, CNM

## 2022-02-09 ENCOUNTER — Ambulatory Visit (INDEPENDENT_AMBULATORY_CARE_PROVIDER_SITE_OTHER): Payer: Medicaid Other | Admitting: Obstetrics and Gynecology

## 2022-02-09 VITALS — BP 124/76 | HR 85 | Wt 166.0 lb

## 2022-02-09 DIAGNOSIS — Z3A28 28 weeks gestation of pregnancy: Secondary | ICD-10-CM | POA: Diagnosis not present

## 2022-02-09 DIAGNOSIS — Z3483 Encounter for supervision of other normal pregnancy, third trimester: Secondary | ICD-10-CM

## 2022-02-09 DIAGNOSIS — Z3480 Encounter for supervision of other normal pregnancy, unspecified trimester: Secondary | ICD-10-CM

## 2022-02-09 NOTE — Progress Notes (Signed)
Pt declined Tdap and flu

## 2022-02-09 NOTE — Progress Notes (Signed)
   PRENATAL VISIT NOTE  Subjective:  Stephanie Pratt is a 30 y.o. G2P1001 at [redacted]w[redacted]d being seen today for ongoing prenatal care.  She is currently monitored for the following issues for this low-risk pregnancy and has Migraine without aura; GERD (gastroesophageal reflux disease); Mixed stress and urge urinary incontinence; Supervision of other normal pregnancy, antepartum; Family history of breast cancer in mother; and History of cervical LEEP biopsy affecting care of mother, antepartum on their problem list.  Patient reports no complaints.  Contractions: Not present. Vag. Bleeding: None.  Movement: Present. Denies leaking of fluid.   The following portions of the patient's history were reviewed and updated as appropriate: allergies, current medications, past family history, past medical history, past social history, past surgical history and problem list.   Objective:   Vitals:   02/09/22 0808  BP: 124/76  Pulse: 85  Weight: 166 lb (75.3 kg)    Fetal Status: Fetal Heart Rate (bpm): 141 Fundal Height: 30 cm Movement: Present     General:  Alert, oriented and cooperative. Patient is in no acute distress.  Skin: Skin is warm and dry. No rash noted.   Cardiovascular: Normal heart rate noted  Respiratory: Normal respiratory effort, no problems with respiration noted  Abdomen: Soft, gravid, appropriate for gestational age.  Pain/Pressure: Absent     Pelvic: Cervical exam deferred        Extremities: Normal range of motion.  Edema: Trace  Mental Status: Normal mood and affect. Normal behavior. Normal judgment and thought content.   Assessment and Plan:  Pregnancy: G2P1001 at [redacted]w[redacted]d 1. Supervision of other normal pregnancy, antepartum  - Declined Flu/TDAP shot - Has a hemorrhoid. Doing OTC treatment.  - 2Hr GTT w/ 1 Hr Carpenter 75 g - CBC - HIV antibody (with reflex) - RPR  Preterm labor symptoms and general obstetric precautions including but not limited to vaginal bleeding,  contractions, leaking of fluid and fetal movement were reviewed in detail with the patient. Please refer to After Visit Summary for other counseling recommendations.   No follow-ups on file.  No future appointments.  Noni Saupe, NP

## 2022-02-10 LAB — CBC
HCT: 38.6 % (ref 35.0–45.0)
Hemoglobin: 13.3 g/dL (ref 11.7–15.5)
MCH: 32.4 pg (ref 27.0–33.0)
MCHC: 34.5 g/dL (ref 32.0–36.0)
MCV: 94.1 fL (ref 80.0–100.0)
MPV: 11.6 fL (ref 7.5–12.5)
Platelets: 235 10*3/uL (ref 140–400)
RBC: 4.1 10*6/uL (ref 3.80–5.10)
RDW: 12.1 % (ref 11.0–15.0)
WBC: 11.5 10*3/uL — ABNORMAL HIGH (ref 3.8–10.8)

## 2022-02-10 LAB — RPR: RPR Ser Ql: NONREACTIVE

## 2022-02-10 LAB — HIV ANTIBODY (ROUTINE TESTING W REFLEX): HIV 1&2 Ab, 4th Generation: NONREACTIVE

## 2022-02-10 LAB — 2HR GTT W 1 HR, CARPENTER, 75 G
Glucose, 1 Hr, Gest: 150 mg/dL (ref 65–179)
Glucose, 2 Hr, Gest: 119 mg/dL (ref 65–152)
Glucose, Fasting, Gest: 74 mg/dL (ref 65–91)

## 2022-02-11 ENCOUNTER — Other Ambulatory Visit: Payer: Self-pay | Admitting: Obstetrics and Gynecology

## 2022-02-11 MED ORDER — HYDROCORTISONE ACETATE 25 MG RE SUPP: 25.0000 mg | Freq: Two times a day (BID) | RECTAL | 1 refills | Status: DC

## 2022-02-23 ENCOUNTER — Ambulatory Visit (INDEPENDENT_AMBULATORY_CARE_PROVIDER_SITE_OTHER): Payer: Medicaid Other

## 2022-02-23 VITALS — BP 125/88 | HR 77 | Wt 169.0 lb

## 2022-02-23 DIAGNOSIS — Z3480 Encounter for supervision of other normal pregnancy, unspecified trimester: Secondary | ICD-10-CM

## 2022-02-23 DIAGNOSIS — Z3483 Encounter for supervision of other normal pregnancy, third trimester: Secondary | ICD-10-CM

## 2022-02-23 DIAGNOSIS — Z3A30 30 weeks gestation of pregnancy: Secondary | ICD-10-CM

## 2022-02-23 NOTE — Progress Notes (Signed)
   PRENATAL VISIT NOTE  Subjective:  Stephanie Pratt is a 31 y.o. G2P1001 at [redacted]w[redacted]d being seen today for ongoing prenatal care.  She is currently monitored for the following issues for this low-risk pregnancy and has Migraine without aura; GERD (gastroesophageal reflux disease); Mixed stress and urge urinary incontinence; Supervision of other normal pregnancy, antepartum; Family history of breast cancer in mother; and History of cervical LEEP biopsy affecting care of mother, antepartum on their problem list.  Patient reports no complaints.  Contractions: Not present. Vag. Bleeding: None.  Movement: Present. Denies leaking of fluid.   The following portions of the patient's history were reviewed and updated as appropriate: allergies, current medications, past family history, past medical history, past social history, past surgical history and problem list.   Objective:   Vitals:   02/23/22 1320  BP: 125/88  Pulse: 77  Weight: 169 lb (76.7 kg)    Fetal Status: Fetal Heart Rate (bpm): 154 Fundal Height: 30 cm Movement: Present     General:  Alert, oriented and cooperative. Patient is in no acute distress.  Skin: Skin is warm and dry. No rash noted.   Cardiovascular: Normal heart rate noted  Respiratory: Normal respiratory effort, no problems with respiration noted  Abdomen: Soft, gravid, appropriate for gestational age.  Pain/Pressure: Absent     Pelvic: Cervical exam deferred        Extremities: Normal range of motion.  Edema: None  Mental Status: Normal mood and affect. Normal behavior. Normal judgment and thought content.   Assessment and Plan:  Pregnancy: G2P1001 at [redacted]w[redacted]d 1. Supervision of other normal pregnancy, antepartum - Routine OB. Doing well, no concerns - Reviewed contraception. Does not want Nexplanon. Considering OCP's. Information provided - Anticipatory guidance for upcoming appointments provided  2. [redacted] weeks gestation of pregnancy - FH appropriate - Endorses  active fetal movement   Preterm labor symptoms and general obstetric precautions including but not limited to vaginal bleeding, contractions, leaking of fluid and fetal movement were reviewed in detail with the patient. Please refer to After Visit Summary for other counseling recommendations.   Return in about 2 weeks (around 03/09/2022) for LOB.  Future Appointments  Date Time Provider [place]  03/09/2022 11:10 AM Rasch, Artist Pais, NP [facility]    Renee Harder, CNM

## 2022-03-09 ENCOUNTER — Telehealth: Payer: Self-pay | Admitting: *Deleted

## 2022-03-09 ENCOUNTER — Encounter: Payer: Medicaid Other | Admitting: Obstetrics and Gynecology

## 2022-03-09 NOTE — Telephone Encounter (Signed)
Left patient an urgent message to call and reschedule appointment due to provider being sick and having to leave early this morning.

## 2022-03-16 ENCOUNTER — Ambulatory Visit (INDEPENDENT_AMBULATORY_CARE_PROVIDER_SITE_OTHER): Payer: Medicaid Other | Admitting: Obstetrics and Gynecology

## 2022-03-16 VITALS — BP 113/68 | HR 88 | Wt 176.0 lb

## 2022-03-16 DIAGNOSIS — Z3480 Encounter for supervision of other normal pregnancy, unspecified trimester: Secondary | ICD-10-CM

## 2022-03-16 DIAGNOSIS — Z3483 Encounter for supervision of other normal pregnancy, third trimester: Secondary | ICD-10-CM

## 2022-03-16 NOTE — Progress Notes (Signed)
   PRENATAL VISIT NOTE  Subjective:  Stephanie Pratt is a 31 y.o. G2P1001 at [redacted]w[redacted]d being seen today for ongoing prenatal care.  She is currently monitored for the following issues for this low-risk pregnancy and has Migraine without aura; GERD (gastroesophageal reflux disease); Supervision of other normal pregnancy, antepartum; Family history of breast cancer in mother; and History of cervical LEEP biopsy affecting care of mother, antepartum on their problem list.  Patient reports no complaints.  Contractions: Not present. Vag. Bleeding: None.  Movement: Present. Denies leaking of fluid.   The following portions of the patient's history were reviewed and updated as appropriate: allergies, current medications, past family history, past medical history, past social history, past surgical history and problem list.   Objective:   Vitals:   03/16/22 1059  BP: 113/68  Pulse: 88  Weight: 176 lb (79.8 kg)    Fetal Status: Fetal Heart Rate (bpm): 149   Movement: Present     General:  Alert, oriented and cooperative. Patient is in no acute distress.  Skin: Skin is warm and dry. No rash noted.   Cardiovascular: Normal heart rate noted  Respiratory: Normal respiratory effort, no problems with respiration noted  Abdomen: Soft, gravid, appropriate for gestational age.  Pain/Pressure: Absent     Pelvic: Cervical exam deferred        Extremities: Normal range of motion.  Edema: Trace  Mental Status: Normal mood and affect. Normal behavior. Normal judgment and thought content.   Assessment and Plan:  Pregnancy: G2P1001 at [redacted]w[redacted]d 1. Supervision of other normal pregnancy, antepartum  Doing well, had some swelling in feet/hands over the weekend.  Baby is moving well Last MFM [place] is normal.    Preterm labor symptoms and general obstetric precautions including but not limited to vaginal bleeding, contractions, leaking of fluid and fetal movement were reviewed in detail with the patient. Please  refer to After Visit Summary for other counseling recommendations.   No follow-ups on file.  No future appointments.  Venia Carbon, NP

## 2022-04-06 ENCOUNTER — Other Ambulatory Visit (HOSPITAL_COMMUNITY)
Admission: RE | Admit: 2022-04-06 | Discharge: 2022-04-06 | Disposition: A | Discharge status: Home / Self Care | Payer: Medicaid Other | Source: Ambulatory Visit

## 2022-04-06 ENCOUNTER — Ambulatory Visit (INDEPENDENT_AMBULATORY_CARE_PROVIDER_SITE_OTHER): Payer: Medicaid Other

## 2022-04-06 VITALS — BP 125/79 | HR 88 | Wt 184.0 lb

## 2022-04-06 DIAGNOSIS — Z3480 Encounter for supervision of other normal pregnancy, unspecified trimester: Secondary | ICD-10-CM | POA: Diagnosis present

## 2022-04-06 DIAGNOSIS — Z3A36 36 weeks gestation of pregnancy: Secondary | ICD-10-CM

## 2022-04-06 NOTE — Progress Notes (Signed)
   PRENATAL VISIT NOTE  Subjective:  Stephanie Pratt is a 31 y.o. G2P1001 at [redacted]w[redacted]d being seen today for ongoing prenatal care.  She is currently monitored for the following issues for this low-risk pregnancy and has Migraine without aura; GERD (gastroesophageal reflux disease); Supervision of other normal pregnancy, antepartum; Family history of breast cancer in mother; and History of cervical LEEP biopsy affecting care of mother, antepartum on their problem list.  Patient reports no complaints.  Contractions: Not present. Vag. Bleeding: None.  Movement: Present. Denies leaking of fluid.   The following portions of the patient's history were reviewed and updated as appropriate: allergies, current medications, past family history, past medical history, past social history, past surgical history and problem list.   Objective:   Vitals:   04/06/22 1054  BP: 125/79  Pulse: 88  Weight: 184 lb (83.5 kg)    Fetal Status: Fetal Heart Rate (bpm): 140 Fundal Height: 36 cm Movement: Present  Presentation: Vertex  General:  Alert, oriented and cooperative. Patient is in no acute distress.  Skin: Skin is warm and dry. No rash noted.   Cardiovascular: Normal heart rate noted  Respiratory: Normal respiratory effort, no problems with respiration noted  Abdomen: Soft, gravid, appropriate for gestational age.  Pain/Pressure: Present     Pelvic: Cervical exam deferred Dilation: 1 Effacement (%): 50 Station: -3  Extremities: Normal range of motion.  Edema: Mild pitting, slight indentation  Mental Status: Normal mood and affect. Normal behavior. Normal judgment and thought content.   Assessment and Plan:  Pregnancy: G2P1001 at [redacted]w[redacted]d 1. Supervision of other normal pregnancy, antepartum - Routine OB. Doing well.  - Reports some swelling in feet/hands. Encouraged compression socks and increase PO water intake - Cultures today. Cervical exam per patient request - Anticipatory guidance regarding  upcoming appointments provided   - Cervicovaginal ancillary only( [place]) - Culture, beta strep (group b only)  2. [redacted] weeks gestation of pregnancy - FH appropriate - Endorses active fetal movement   Preterm labor symptoms and general obstetric precautions including but not limited to vaginal bleeding, contractions, leaking of fluid and fetal movement were reviewed in detail with the patient. Please refer to After Visit Summary for other counseling recommendations.   Return in about 1 week (around 04/13/2022).  Future Appointments  Date Time Provider Department Center  04/15/2022 11:10 AM Lennart Pall, MD [facility]  04/22/2022 11:10 AM Milas Hock, MD [facility]  04/29/2022 11:10 AM Milas Hock, MD [facility]  05/06/2022 11:10 AM Lennart Pall, MD [facility]    Brand Males, CNM

## 2022-04-07 LAB — CERVICOVAGINAL ANCILLARY ONLY
Chlamydia: NEGATIVE
Comment: NEGATIVE
Comment: NORMAL
Neisseria Gonorrhea: NEGATIVE

## 2022-04-09 LAB — CULTURE, BETA STREP (GROUP B ONLY)
MICRO NUMBER:: 14337372
SPECIMEN QUALITY:: ADEQUATE

## 2022-04-15 ENCOUNTER — Encounter (HOSPITAL_COMMUNITY): Payer: Self-pay | Admitting: Obstetrics and Gynecology

## 2022-04-15 ENCOUNTER — Telehealth (INDEPENDENT_AMBULATORY_CARE_PROVIDER_SITE_OTHER): Payer: Medicaid Other | Admitting: Obstetrics and Gynecology

## 2022-04-15 ENCOUNTER — Inpatient Hospital Stay (HOSPITAL_COMMUNITY)
Admission: AD | Admit: 2022-04-15 | Discharge: 2022-04-15 | Disposition: A | Discharge status: Home / Self Care | Payer: Medicaid Other | Attending: Obstetrics and Gynecology | Admitting: Obstetrics and Gynecology

## 2022-04-15 VITALS — BP 140/81

## 2022-04-15 DIAGNOSIS — Z3A37 37 weeks gestation of pregnancy: Secondary | ICD-10-CM | POA: Insufficient documentation

## 2022-04-15 DIAGNOSIS — O163 Unspecified maternal hypertension, third trimester: Secondary | ICD-10-CM

## 2022-04-15 DIAGNOSIS — O26893 Other specified pregnancy related conditions, third trimester: Secondary | ICD-10-CM | POA: Insufficient documentation

## 2022-04-15 DIAGNOSIS — R030 Elevated blood-pressure reading, without diagnosis of hypertension: Secondary | ICD-10-CM | POA: Diagnosis not present

## 2022-04-15 DIAGNOSIS — O98513 Other viral diseases complicating pregnancy, third trimester: Secondary | ICD-10-CM | POA: Diagnosis not present

## 2022-04-15 DIAGNOSIS — U071 COVID-19: Secondary | ICD-10-CM

## 2022-04-15 DIAGNOSIS — Z3483 Encounter for supervision of other normal pregnancy, third trimester: Secondary | ICD-10-CM

## 2022-04-15 DIAGNOSIS — Z3480 Encounter for supervision of other normal pregnancy, unspecified trimester: Secondary | ICD-10-CM

## 2022-04-15 LAB — COMPREHENSIVE METABOLIC PANEL
ALT: 30 U/L (ref 0–44)
AST: 36 U/L (ref 15–41)
Albumin: 2.9 g/dL — ABNORMAL LOW (ref 3.5–5.0)
Alkaline Phosphatase: 146 U/L — ABNORMAL HIGH (ref 38–126)
Anion gap: 11 (ref 5–15)
BUN: 6 mg/dL (ref 6–20)
CO2: 20 mmol/L — ABNORMAL LOW (ref 22–32)
Calcium: 9.6 mg/dL (ref 8.9–10.3)
Chloride: 108 mmol/L (ref 98–111)
Creatinine, Ser: 0.68 mg/dL (ref 0.44–1.00)
GFR, Estimated: >60 mL/min (ref >60)
Glucose, Bld: 68 mg/dL — ABNORMAL LOW (ref 70–99)
Potassium: 3.8 mmol/L (ref 3.5–5.1)
Sodium: 139 mmol/L (ref 135–145)
Total Bilirubin: 0.6 mg/dL (ref 0.3–1.2)
Total Protein: 6.2 g/dL — ABNORMAL LOW (ref 6.5–8.1)

## 2022-04-15 LAB — PROTEIN / CREATININE RATIO, URINE
Creatinine, Urine: 17 mg/dL
Total Protein, Urine: <6 mg/dL

## 2022-04-15 LAB — CBC
HCT: 39 % (ref 36.0–46.0)
Hemoglobin: 14.1 g/dL (ref 12.0–15.0)
MCH: 32.4 pg (ref 26.0–34.0)
MCHC: 36.2 g/dL — ABNORMAL HIGH (ref 30.0–36.0)
MCV: 89.7 fL (ref 80.0–100.0)
Platelets: 231 10*3/uL (ref 150–400)
RBC: 4.35 MIL/uL (ref 3.87–5.11)
RDW: 12.6 % (ref 11.5–15.5)
WBC: 8.8 10*3/uL (ref 4.0–10.5)
nRBC: 0 % (ref 0.0–0.2)

## 2022-04-15 LAB — URINALYSIS, ROUTINE W REFLEX MICROSCOPIC
Bilirubin Urine: NEGATIVE
Glucose, UA: NEGATIVE mg/dL
Hgb urine dipstick: NEGATIVE
Ketones, ur: NEGATIVE mg/dL
Nitrite: NEGATIVE
Protein, ur: NEGATIVE mg/dL
Specific Gravity, Urine: 1.002 — ABNORMAL LOW (ref 1.005–1.030)
pH: 6 (ref 5.0–8.0)

## 2022-04-15 NOTE — MAU Provider Note (Signed)
History     CSN: [id number]  Arrival date and time: 04/15/22 1136   Event Date/Time   First Provider Initiated Contact with Patient 04/15/22 1214      Chief Complaint  Patient presents with   Hypertension   HPI  Stephanie Pratt is a 31 y.o. G2P1001 at [redacted]w[redacted]d who presents for evaluation of elevated blood pressure. Patient reports she presented for her virtual OB visit today and had new onset HTN. She had a virtual visit because she was diagnosed with COVID on 12/26. She denies any HA, visual changes or epigastric pain. She reports feeling generally unwell in the setting of COVID.  She denies any vaginal bleeding, discharge, and leaking of fluid. Denies any constipation, diarrhea or any urinary complaints. Reports normal fetal movement.   OB History     Gravida  2   Para  1   Term  1   Preterm      AB      Living  1      SAB      IAB      Ectopic      Multiple      Live Births  1           Past Medical History:  Diagnosis Date   Abnormal Pap smear of cervix    IBS (irritable bowel syndrome)     Past Surgical History:  Procedure Laterality Date   LAPAROSCOPIC APPENDECTOMY     LEEP     WISDOM TOOTH EXTRACTION      Family History  Problem Relation Age of Onset   Breast cancer Mother    Diabetes Father    Heart disease Father    Hypertension Father    COPD Father    Congestive Heart Failure Father    Vascular Disease Father    Cancer Maternal Grandmother        breast    Social History   Tobacco Use   Smoking status: Former    Packs/day: 0.30    Years: 2.00    Total pack years: 0.60    Types: Cigarettes   Smokeless tobacco: Never  Vaping Use   Vaping Use: Never used  Substance Use Topics   Alcohol use: Not Currently    Comment: occassional wine   Drug use: No    Allergies: No Known Allergies  No medications prior to admission.    Review of Systems  Constitutional: Negative.  Negative for fatigue and fever.  HENT:   Positive for congestion.   Respiratory:  Positive for cough. Negative for shortness of breath.   Cardiovascular: Negative.  Negative for chest pain.  Gastrointestinal: Negative.  Negative for abdominal pain, constipation, diarrhea, nausea and vomiting.  Genitourinary: Negative.  Negative for dysuria, vaginal bleeding and vaginal discharge.  Musculoskeletal:  Positive for myalgias.  Neurological: Negative.  Negative for dizziness and headaches.   Physical Exam   Blood pressure 132/83, pulse 88, temperature 98.1 F (36.7 C), temperature source Oral, resp. rate 16, height 4\' 11"  (1.499 m), weight 84.2 kg, last menstrual period 07/21/2021, SpO2 96 %.  Patient Vitals for the past 24 hrs:  BP Temp Temp src Pulse Resp SpO2 Height Weight  04/15/22 1330 132/83 -- -- 88 -- -- -- --  04/15/22 1315 124/82 -- -- 88 -- -- -- --  04/15/22 1300 128/80 -- -- 86 -- -- -- --  04/15/22 1230 129/85 -- -- 83 -- -- -- --  04/15/22 1215 135/81 -- -- 81 -- -- -- --  04/15/22 1209 (!) 138/98 -- -- 92 -- -- -- --  04/15/22 1148 (!) 144/97 98.1 F (36.7 C) Oral 88 16 96 % -- --  04/15/22 1144 -- -- -- -- -- -- 4\' 11"  (1.499 m) 84.2 kg    Physical Exam Vitals and nursing note reviewed.  Constitutional:      General: She is not in acute distress.    Appearance: She is well-developed.  HENT:     Head: Normocephalic.  Eyes:     Pupils: Pupils are equal, round, and reactive to light.  Cardiovascular:     Rate and Rhythm: Normal rate and regular rhythm.     Heart sounds: Normal heart sounds.  Pulmonary:     Effort: Pulmonary effort is normal. No respiratory distress.     Breath sounds: Normal breath sounds.  Abdominal:     General: Bowel sounds are normal. There is no distension.     Palpations: Abdomen is soft.     Tenderness: There is no abdominal tenderness.  Skin:    General: Skin is warm and dry.  Neurological:     Mental Status: She is alert and oriented to person, place, and time.  Psychiatric:         Mood and Affect: Mood normal.        Behavior: Behavior normal.        Thought Content: Thought content normal.        Judgment: Judgment normal.     Fetal Tracing:  Baseline: 130 Variability: moderate Accels: 15x15 Decels: none  Toco: occasional uc's with UI  MAU Course  Procedures  Results for orders placed or performed during the hospital encounter of 04/15/22 (from the past 24 hour(s))  CBC     Status: Abnormal   Collection Time: 04/15/22 12:19 PM  Result Value Ref Range   WBC 8.8 4.0 - 10.5 K/uL   RBC 4.35 3.87 - 5.11 MIL/uL   Hemoglobin 14.1 12.0 - 15.0 g/dL   HCT 04/17/22 67.6 - 72.0 %   MCV 89.7 80.0 - 100.0 fL   MCH 32.4 26.0 - 34.0 pg   MCHC 36.2 (H) 30.0 - 36.0 g/dL   RDW 94.7 09.6 - 28.3 %   Platelets 231 150 - 400 K/uL   nRBC 0.0 0.0 - 0.2 %  Comprehensive metabolic panel     Status: Abnormal   Collection Time: 04/15/22 12:19 PM  Result Value Ref Range   Sodium 139 135 - 145 mmol/L   Potassium 3.8 3.5 - 5.1 mmol/L   Chloride 108 98 - 111 mmol/L   CO2 20 (L) 22 - 32 mmol/L   Glucose, Bld 68 (L) 70 - 99 mg/dL   BUN 6 6 - 20 mg/dL   Creatinine, Ser 04/17/22 0.44 - 1.00 mg/dL   Calcium 9.6 8.9 - 9.47 mg/dL   Total Protein 6.2 (L) 6.5 - 8.1 g/dL   Albumin 2.9 (L) 3.5 - 5.0 g/dL   AST 36 15 - 41 U/L   ALT 30 0 - 44 U/L   Alkaline Phosphatase 146 (H) 38 - 126 U/L   Total Bilirubin 0.6 0.3 - 1.2 mg/dL   GFR, Estimated 65.4 >65 mL/min   Anion gap 11 5 - 15  Urinalysis, Routine w reflex microscopic     Status: Abnormal   Collection Time: 04/15/22 12:26 PM  Result Value Ref Range   Color, Urine STRAW (A) YELLOW   APPearance CLEAR CLEAR   Specific Gravity, Urine 1.002 (L) 1.005 -  1.030   pH 6.0 5.0 - 8.0   Glucose, UA NEGATIVE NEGATIVE mg/dL   Hgb urine dipstick NEGATIVE NEGATIVE   Bilirubin Urine NEGATIVE NEGATIVE   Ketones, ur NEGATIVE NEGATIVE mg/dL   Protein, ur NEGATIVE NEGATIVE mg/dL   Nitrite NEGATIVE NEGATIVE   Leukocytes,Ua SMALL (A) NEGATIVE    RBC / HPF 0-5 0 - 5 RBC/hpf   WBC, UA 0-5 0 - 5 WBC/hpf   Bacteria, UA RARE (A) NONE SEEN   Squamous Epithelial / LPF 6-10 0 - 5 /HPF  Protein / creatinine ratio, urine     Status: None   Collection Time: 04/15/22 12:26 PM  Result Value Ref Range   Creatinine, Urine 17 mg/dL   Total Protein, Urine <6 mg/dL   Protein Creatinine Ratio        0.00 - 0.15 mg/mg[Cre]    MDM Labs ordered and reviewed.   CBC, CMP, Protein/creat ratio NST reactive  CNM consulted with Dr. Macon Large regarding presentation and results- MD recommends BP check as scheduled in the office next week because of COVID isolation precautions. Encouraged daily BP check and to present to MAU with symptoms or severe range BPs. Instructed to call office if BP elevated on home cuff.  Assessment and Plan   1. Elevated blood pressure affecting pregnancy in third trimester, antepartum   2. [redacted] weeks gestation of pregnancy   3. COVID-19 affecting pregnancy in third trimester     -Discharge home in stable condition -Preeclampsia precautions discussed -Patient advised to follow-up with OB on 1/4 for BP check and ROB visit -Patient may return to MAU as needed or if her condition were to change or worsen  Rolm Bookbinder, CNM 04/15/2022, 12:15 PM

## 2022-04-15 NOTE — MAU Note (Signed)
Stephanie Pratt is a 31 y.o. at [redacted]w[redacted]d here in MAU reporting: + covid test on Tuesday. Had virtual visit today and had elevated BP on home cuff and was advised to come in for labs. No headaches, visual changes, or RUQ pain. +FM. No bleeding or LOF.  Onset of complaint: today  Pain score: 0/10  Vitals:   04/15/22 1148  BP: (!) 144/97  Pulse: 88  Resp: 16  Temp: 98.1 F (36.7 C)  SpO2: 96%     FHT:148  Lab orders placed from triage: UA

## 2022-04-15 NOTE — Progress Notes (Signed)
    TELEHEALTH OBSTETRICS VISIT ENCOUNTER NOTE  Provider location: Center for [facility]   Patient location: Home  I connected with Stephanie Pratt on 04/15/22 at  9:30 AM EST by telephone at home and verified that I am speaking with the correct person using two identifiers. Of note, unable to do video encounter due to technical difficulties.    I discussed the limitations, risks, security and privacy concerns of performing an evaluation and management service by telephone and the availability of in person appointments. I also discussed with the patient that there may be a patient responsible charge related to this service. The patient expressed understanding and agreed to proceed.  Subjective:  Stephanie Pratt is a 31 y.o. G2P1001 at [redacted]w[redacted]d being followed for ongoing prenatal care.  She is currently monitored for the following issues for this low-risk pregnancy and has Migraine without aura; GERD (gastroesophageal reflux disease); Supervision of other normal pregnancy, antepartum; Family history of breast cancer in mother; and History of cervical LEEP biopsy affecting care of mother, antepartum on their problem list.  Patient reports  improving since being diagnosed with Covid. Still has persistent cold symptoms but breathing ok . Took paxlovid. Denies HA, visual changes, RUQ pain. Reports fetal movement. Denies any contractions, bleeding or leaking of fluid.   The following portions of the patient's history were reviewed and updated as appropriate: allergies, current medications, past family history, past medical history, past social history, past surgical history and problem list.   Objective:  Blood pressure (!) 140/81, last menstrual period 07/21/2021. Repeat BP 150/90. General:  Alert, oriented and cooperative.   Mental Status: Normal mood and affect perceived. Normal judgment and thought content.  Rest of physical exam deferred due to type of  encounter  Assessment and Plan:  Pregnancy: G2P1001 at [redacted]w[redacted]d 1. Supervision of other normal pregnancy, antepartum GBS negative  2. Elevated blood pressure reading Home BP 140/81 with repeat 150/90 Asymptomatic She is in her infectious covid window, so we are unable to bring her into the office for labs/NST/repeat BP. Will send to MAU for r/o preE. Pt agreeable. Pt counseled on possible IOL if persistently elevated blood pressures in MAU, but that she will be discharged home if BP normal/no other concerning findings. MAU provider called   Future Appointments  Date Time Provider Department Center  04/22/2022 11:10 AM Milas Hock, MD [facility]  04/29/2022 11:10 AM Milas Hock, MD [facility]  05/06/2022 11:10 AM Lennart Pall, MD [facility]   Lennart Pall, MD Center for [facility]

## 2022-04-15 NOTE — Discharge Instructions (Signed)

## 2022-04-19 NOTE — L&D Delivery Note (Signed)
Delivery Note Pushed for 1hr and 13 min to delivery. FHR reactive with intermittent variable decels during second stage, variability remained moderate throughout.  At 7:08 AM a viable and healthy female was delivered via Vaginal, Spontaneous (Presentation:  Direct OA).  APGAR: 0, 3; weight 9 lb 1 oz (4110 g).  5 minute Shouder Dystocia   When head delivered, "turtling" noted. RNs at bedside notified to prepare for shoulder maneuvers.  McRoberts employed without success.  Attempted to deliver posterior shoulder since I was unable to get my fingers in to grasp anterior shoulder.  I could reach posterior axilla but could not maneuver it.  Turned to side and attempted Woods corkscrew without success.   Gaskin mauneuver employed and above maneuvers repeated.  We also cut a mediolateral episiotomy.   During the above processes multiple team members arrived including [place] Crescenzo-Dishmon CNM, Dr Marvel Plan, and Haskel Khan CNM, and Dr Clement Husbands..    Turned to left side and top leg hyperflexed.  Retried Woods while grasping posterior axilla and tried to reach arms to sweep but was unable to reach either arm.  Felt movement with Sherral Hammers and was able to deliver posterior arm.  Cord gas obtained.  Baby handed to awaiting NICU team.   Placenta status: Spontaneous, Intact.  Cord: 3 vessels with the following complications: Nuchal cord x 2, reduced.   Cord pH: 7.22  Anesthesia: Epidural Episiotomy: Right Mediolateral Lacerations: 3rd degree 3A   Repaired by Dr Sherle Poe Suture Repair: 2.0 3.0 vicryl Est. Blood Loss (mL):    Mom to postpartum.  Baby to NICU.  Stephanie Pratt 04/23/2022, 7:48 AM

## 2022-04-21 ENCOUNTER — Telehealth: Payer: Self-pay

## 2022-04-21 NOTE — Progress Notes (Signed)
   PRENATAL VISIT NOTE  Subjective:  Stephanie Pratt is a 32 y.o. G2P1001 at [redacted]w[redacted]d being seen today for ongoing prenatal care.  She is currently monitored for the following issues for this low-risk pregnancy and has Migraine without aura; GERD (gastroesophageal reflux disease); Supervision of other normal pregnancy, antepartum; Family history of breast cancer in mother; and History of cervical LEEP biopsy affecting care of mother, antepartum on their problem list.  Patient reports no complaints.  Contractions: Not present. Vag. Bleeding: None.  Movement: Present. Denies leaking of fluid.   The following portions of the patient's history were reviewed and updated as appropriate: allergies, current medications, past family history, past medical history, past social history, past surgical history and problem list.   Objective:   Vitals:   04/22/22 1126 04/22/22 1140  BP: (!) 150/94 132/88  Pulse: 83 91  Weight: 185 lb (83.9 kg)     Fetal Status: Fetal Heart Rate (bpm): 153 Fundal Height: 40 cm Movement: Present     General:  Alert, oriented and cooperative. Patient is in no acute distress.  Skin: Skin is warm and dry. No rash noted.   Cardiovascular: Normal heart rate noted  Respiratory: Normal respiratory effort, no problems with respiration noted  Abdomen: Soft, gravid, appropriate for gestational age.  Pain/Pressure: Absent     Pelvic: Cervical exam performed in the presence of a chaperone Dilation: 3 Effacement (%): 80 Station: -2  Extremities: Normal range of motion.  Edema: Mild pitting, slight indentation  Mental Status: Normal mood and affect. Normal behavior. Normal judgment and thought content.   Assessment and Plan:  Pregnancy: G2P1001 at [redacted]w[redacted]d 1. Supervision of other normal pregnancy, antepartum GBS neg BP today is 150/94. Recheck was improved but she also had an elevated BP in MAU, the office and by home   Term labor symptoms and general obstetric precautions  including but not limited to vaginal bleeding, contractions, leaking of fluid and fetal movement were reviewed in detail with the patient. Please refer to After Visit Summary for other counseling recommendations.   No follow-ups on file.  Future Appointments  Date Time Provider Department Center  04/29/2022 11:10 AM Milas Hock, MD [facility]  05/06/2022 11:10 AM Lennart Pall, MD [facility]    Milas Hock, MD

## 2022-04-21 NOTE — Telephone Encounter (Signed)
Attempted to contact patient, no answer. Left vm asking pt to contact office or retake blood pressure and send a my chart message with no reading.

## 2022-04-22 ENCOUNTER — Encounter (HOSPITAL_COMMUNITY): Payer: Self-pay | Admitting: Family Medicine

## 2022-04-22 ENCOUNTER — Ambulatory Visit (INDEPENDENT_AMBULATORY_CARE_PROVIDER_SITE_OTHER): Payer: Medicaid Other | Admitting: Obstetrics and Gynecology

## 2022-04-22 ENCOUNTER — Encounter: Payer: Self-pay | Admitting: Obstetrics and Gynecology

## 2022-04-22 ENCOUNTER — Inpatient Hospital Stay (HOSPITAL_COMMUNITY): Payer: Medicaid Other | Admitting: Anesthesiology

## 2022-04-22 ENCOUNTER — Inpatient Hospital Stay (HOSPITAL_COMMUNITY)
Admission: AD | Admit: 2022-04-22 | Discharge: 2022-04-25 | DRG: 768 | Disposition: A | Discharge status: Home / Self Care | Payer: Medicaid Other | Attending: Family Medicine | Admitting: Family Medicine

## 2022-04-22 VITALS — BP 132/88 | HR 91 | Wt 185.0 lb

## 2022-04-22 DIAGNOSIS — Z3A38 38 weeks gestation of pregnancy: Secondary | ICD-10-CM | POA: Diagnosis not present

## 2022-04-22 DIAGNOSIS — O3663X0 Maternal care for excessive fetal growth, third trimester, not applicable or unspecified: Secondary | ICD-10-CM | POA: Diagnosis present

## 2022-04-22 DIAGNOSIS — O134 Gestational [pregnancy-induced] hypertension without significant proteinuria, complicating childbirth: Secondary | ICD-10-CM | POA: Diagnosis present

## 2022-04-22 DIAGNOSIS — Z3480 Encounter for supervision of other normal pregnancy, unspecified trimester: Secondary | ICD-10-CM

## 2022-04-22 DIAGNOSIS — O139 Gestational [pregnancy-induced] hypertension without significant proteinuria, unspecified trimester: Secondary | ICD-10-CM | POA: Diagnosis present

## 2022-04-22 DIAGNOSIS — Z87891 Personal history of nicotine dependence: Secondary | ICD-10-CM

## 2022-04-22 DIAGNOSIS — Z3483 Encounter for supervision of other normal pregnancy, third trimester: Secondary | ICD-10-CM

## 2022-04-22 LAB — CBC WITH DIFFERENTIAL/PLATELET
Abs Immature Granulocytes: 0.1 10*3/uL — ABNORMAL HIGH (ref 0.00–0.07)
Basophils Absolute: 0 10*3/uL (ref 0.0–0.1)
Basophils Relative: 0 %
Eosinophils Absolute: 0.2 10*3/uL (ref 0.0–0.5)
Eosinophils Relative: 2 %
HCT: 39.7 % (ref 36.0–46.0)
Hemoglobin: 13.7 g/dL (ref 12.0–15.0)
Immature Granulocytes: 1 %
Lymphocytes Relative: 28 %
Lymphs Abs: 2.9 10*3/uL (ref 0.7–4.0)
MCH: 31.5 pg (ref 26.0–34.0)
MCHC: 34.5 g/dL (ref 30.0–36.0)
MCV: 91.3 fL (ref 80.0–100.0)
Monocytes Absolute: 0.6 10*3/uL (ref 0.1–1.0)
Monocytes Relative: 5 %
Neutro Abs: 6.8 10*3/uL (ref 1.7–7.7)
Neutrophils Relative %: 64 %
Platelets: 234 10*3/uL (ref 150–400)
RBC: 4.35 MIL/uL (ref 3.87–5.11)
RDW: 12.3 % (ref 11.5–15.5)
WBC: 10.5 10*3/uL (ref 4.0–10.5)
nRBC: 0 % (ref 0.0–0.2)

## 2022-04-22 LAB — COMPREHENSIVE METABOLIC PANEL
ALT: 20 U/L (ref 0–44)
AST: 35 U/L (ref 15–41)
Albumin: 3.1 g/dL — ABNORMAL LOW (ref 3.5–5.0)
Alkaline Phosphatase: 142 U/L — ABNORMAL HIGH (ref 38–126)
Anion gap: 12 (ref 5–15)
BUN: 6 mg/dL (ref 6–20)
CO2: 17 mmol/L — ABNORMAL LOW (ref 22–32)
Calcium: 9.1 mg/dL (ref 8.9–10.3)
Chloride: 108 mmol/L (ref 98–111)
Creatinine, Ser: 0.65 mg/dL (ref 0.44–1.00)
GFR, Estimated: >60 mL/min (ref >60)
Glucose, Bld: 88 mg/dL (ref 70–99)
Potassium: 3.8 mmol/L (ref 3.5–5.1)
Sodium: 137 mmol/L (ref 135–145)
Total Bilirubin: 0.9 mg/dL (ref 0.3–1.2)
Total Protein: 6.1 g/dL — ABNORMAL LOW (ref 6.5–8.1)

## 2022-04-22 LAB — TYPE AND SCREEN
ABO/RH(D): A POS
Antibody Screen: NEGATIVE

## 2022-04-22 MED ORDER — OXYCODONE-ACETAMINOPHEN 5-325 MG PO TABS: 1.0000 | ORAL_TABLET | ORAL | Status: DC | PRN

## 2022-04-22 MED ORDER — OXYTOCIN 10 UNIT/ML IJ SOLN: 10.0000 [IU] | Freq: Once | INTRAMUSCULAR | Status: DC | PRN

## 2022-04-22 MED ORDER — OXYTOCIN BOLUS FROM INFUSION
333.0000 mL | Freq: Once | INTRAVENOUS | Status: AC
  Administered 2022-04-23: 333 mL via INTRAVENOUS

## 2022-04-22 MED ORDER — TERBUTALINE SULFATE 1 MG/ML IJ SOLN: 0.2500 mg | Freq: Once | INTRAMUSCULAR | Status: DC | PRN

## 2022-04-22 MED ORDER — LACTATED RINGERS IV SOLN: INTRAVENOUS | Status: DC

## 2022-04-22 MED ORDER — OXYTOCIN-SODIUM CHLORIDE 30-0.9 UT/500ML-% IV SOLN
2.5000 [IU]/h | INTRAVENOUS | Status: DC
  Administered 2022-04-22: 2 m[IU]/min via INTRAVENOUS
  Filled 2022-04-22: qty 500

## 2022-04-22 MED ORDER — LACTATED RINGERS IV SOLN: 500.0000 mL | Freq: Once | INTRAVENOUS | Status: DC

## 2022-04-22 MED ORDER — FENTANYL-BUPIVACAINE-NACL 0.5-0.125-0.9 MG/250ML-% EP SOLN
12.0000 mL/h | EPIDURAL | Status: DC | PRN
  Administered 2022-04-22: 12 mL/h via EPIDURAL
  Filled 2022-04-22: qty 250

## 2022-04-22 MED ORDER — PHENYLEPHRINE 80 MCG/ML (10ML) SYRINGE FOR IV PUSH (FOR BLOOD PRESSURE SUPPORT): 80.0000 ug | PREFILLED_SYRINGE | INTRAVENOUS | Status: DC | PRN

## 2022-04-22 MED ORDER — ONDANSETRON HCL 4 MG/2ML IJ SOLN
4.0000 mg | Freq: Four times a day (QID) | INTRAMUSCULAR | Status: DC | PRN
  Administered 2022-04-23: 4 mg via INTRAVENOUS
  Filled 2022-04-22: qty 2

## 2022-04-22 MED ORDER — EPHEDRINE 5 MG/ML INJ: 10.0000 mg | INTRAVENOUS | Status: DC | PRN

## 2022-04-22 MED ORDER — LACTATED RINGERS IV SOLN: 500.0000 mL | INTRAVENOUS | Status: DC | PRN

## 2022-04-22 MED ORDER — SOD CITRATE-CITRIC ACID 500-334 MG/5ML PO SOLN: 30.0000 mL | ORAL | Status: DC | PRN

## 2022-04-22 MED ORDER — OXYCODONE-ACETAMINOPHEN 5-325 MG PO TABS: 2.0000 | ORAL_TABLET | ORAL | Status: DC | PRN

## 2022-04-22 MED ORDER — LIDOCAINE HCL (PF) 1 % IJ SOLN
30.0000 mL | INTRAMUSCULAR | Status: AC | PRN
  Administered 2022-04-23: 30 mL via SUBCUTANEOUS
  Filled 2022-04-22: qty 30

## 2022-04-22 MED ORDER — ACETAMINOPHEN 325 MG PO TABS: 650.0000 mg | ORAL_TABLET | ORAL | Status: DC | PRN

## 2022-04-22 MED ORDER — FENTANYL CITRATE (PF) 100 MCG/2ML IJ SOLN: 100.0000 ug | INTRAMUSCULAR | Status: DC | PRN

## 2022-04-22 MED ORDER — OXYTOCIN-SODIUM CHLORIDE 30-0.9 UT/500ML-% IV SOLN: 1.0000 m[IU]/min | INTRAVENOUS | Status: DC

## 2022-04-22 MED ORDER — DIPHENHYDRAMINE HCL 50 MG/ML IJ SOLN: 12.5000 mg | INTRAMUSCULAR | Status: DC | PRN

## 2022-04-22 MED ORDER — FENTANYL-BUPIVACAINE-NACL 0.5-0.125-0.9 MG/250ML-% EP SOLN: 12.0000 mL/h | EPIDURAL | Status: DC | PRN

## 2022-04-22 MED ORDER — LIDOCAINE HCL (PF) 1 % IJ SOLN
INTRAMUSCULAR | Status: DC | PRN
  Administered 2022-04-22: 8 mL via EPIDURAL

## 2022-04-22 NOTE — Discharge Summary (Signed)
error 

## 2022-04-22 NOTE — Progress Notes (Signed)
Stephanie Pratt is a 32 y.o. G2P1001 at [redacted]w[redacted]d admitted for induction of labor due to Gestational Hypertension.  Subjective: Sitting up comfortably in bed. Feeling some contractions. Ready for AROM.  Objective: BP (!) 135/92   Pulse 98   Temp 98.6 F (37 C) (Axillary)   Resp 18   LMP 07/21/2021  No intake/output data recorded. No intake/output data recorded.  FHT:  FHR: 140 bpm, variability: moderate,  accelerations:  Present,  decelerations:  Absent UC:   regular, every 2-4 minutes SVE:   Dilation: 3 Effacement (%): 80 - tight scar tissue band from prior LEEP procedure Station: -2 Exam by: Laury Deep, CNM  AROM Procedure: Verbal consent to proceed with AROM after discussion of r/b and active management of labor. moderate amount of clear fluid in return. Patient verbalized an understanding of the plan of care and agrees. Patient tolerated the procedure well.  Labs: Lab Results  Component Value Date   WBC 10.5 04/22/2022   HGB 13.7 04/22/2022   HCT 39.7 04/22/2022   MCV 91.3 04/22/2022   PLT 234 04/22/2022   Lab Results  Component Value Date   AST 35 04/22/2022   ALT 20 04/22/2022   CREATININE 0.65 04/22/2022      Assessment / Plan: Induction of labor due to gestational hypertension,  progressing well on pitocin  Labor: Progressing on Pitocin, will continue to increase Preeclampsia:  labs stable Fetal Wellbeing:  Category I Pain Control:  Labor support without medications I/D:  n/a Anticipated MOD:  NSVD  Laury Deep, CNM 04/22/2022, 7:10 PM

## 2022-04-22 NOTE — Plan of Care (Signed)

## 2022-04-22 NOTE — H&P (Signed)
OBSTETRIC ADMISSION HISTORY AND PHYSICAL  Stephanie Pratt is a 31 y.o. female G2P1001 with IUP at [redacted]w[redacted]d by early U/S presenting for IOL for gHTN.   Reports fetal movement. Denies vaginal bleeding.  She received her prenatal care at  [facility] .  Support person in labor: Stephanie Pratt (spouse)  Ultrasounds Anatomy U/S: Normal  Prenatal History/Complications: Migraines GERD H/O Cervical LEEP  OB BOX: Nursing Staff Provider  Office Location  [place] Dating  First trimester US done in [place] office 5/22  [facility] Model [x ] Traditional [ ]  Centering [ ]  Mom-Baby Dyad    Language  English Anatomy US  Normal with suboptimal views  Flu Vaccine  Declined Genetic/Carrier Screen  NIPS:   NML female AFP:   Negative  Horizon: Pt declined  TDaP Vaccine   Declined Hgb A1C or  GTT Early  Third trimester 74, 150, 119  COVID Vaccine    LAB RESULTS   Rhogam  A POS Blood Type A/RH(D) POSITIVE/-- (05/22 0000)   Baby Feeding Plan Breast Antibody NO ANTIBODIES DETECTED (05/22 0000)  Contraception  Rubella 1.62 (05/22 0000)  Circumcision Yes if boy RPR NON-REACTIVE (10/24 0836)   Pediatrician   walkertwon Ped HBsAg NON-REACTIVE (05/22 0000)   Support Person Stephanie Pratt Neg  Prenatal Classes  HIV NON-REACTIVE (10/24 0836)     BTL Consent  GBS  Negative  VBAC Consent  Pap 7/22       DME Rx [ ]  BP cuff [ ]  Weight Scale Waterbirth  [ ]  Class [ ]  Consent [ ]  CNM visit  PHQ9 & GAD7 [ x ] new OB [ x ] 28 weeks  [  ] 36 weeks Induction  [ ]  Orders Entered [ ] Foley Y/N   Past Medical History: Past Medical History:  Diagnosis Date   Abnormal Pap smear of cervix    IBS (irritable bowel syndrome)     Past Surgical History: Past Surgical History:  Procedure Laterality Date   LAPAROSCOPIC APPENDECTOMY     LEEP     WISDOM TOOTH EXTRACTION      Obstetrical History: OB History     Gravida  2   Para  1   Term  1   Preterm      AB      Living  1      SAB      IAB      Ectopic       Multiple      Live Births  1           Social History: Social History   Socioeconomic History   Marital status: Single    Spouse name: Not on file   Number of children: 1   Years of education: Not on file   Highest education level: Associate degree: academic program  Occupational History   Occupation: student  Tobacco Use   Smoking status: Former    Packs/day: 0.30    Years: 2.00    Total pack years: 0.60    Types: Cigarettes   Smokeless tobacco: Never  Vaping Use   Vaping Use: Never used  Substance and Sexual Activity   Alcohol use: Not Currently    Comment: occassional wine   Drug use: No   Sexual activity: Yes    Partners: Male  Other Topics Concern   Not on file  Social History Narrative   Not on file   Social Determinants of Health   Financial Resource Strain: Not on file  Food  Insecurity: Not on file  Transportation Needs: No Transportation Needs (07/13/2018)   PRAPARE - Administrator, Civil Service (Medical): No    Lack of Transportation (Non-Medical): No  Physical Activity: Not on file  Stress: Not on file  Social Connections: Not on file    Family History: Family History  Problem Relation Age of Onset   Breast cancer Mother    Diabetes Father    Heart disease Father    Hypertension Father    COPD Father    Congestive Heart Failure Father    Vascular Disease Father    Cancer Maternal Grandmother        breast    Allergies: No Known Allergies  Medications Prior to Admission  Medication Sig Dispense Refill Last Dose   Multiple Vitamin (MULTIVITAMIN) tablet Take 1 tablet by mouth daily.      Prenatal Vit-Fe Fumarate-FA (PRENATAL VITAMIN PO) Take by mouth.        Review of Systems  All systems reviewed and negative except as stated in HPI  Blood pressure (!) 135/92, pulse 98, temperature 98.6 F (37 C), temperature source Axillary, resp. rate 18, last menstrual period 07/21/2021. General appearance: alert,  cooperative, and no distress Lungs: no respiratory distress Heart: regular rate  Abdomen: soft, non-tender; gravid Pelvic: adequate Extremities: Homans sign is negative, no sign of DVT Presentation: cephalic Fetal monitoring: Baseline rate 145 bpm   Variability moderate  Accelerations present   Decelerations none Uterine activity: regular every 2-4 mins  Dilation: 3 Effacement (%): 80 Station: -2 Exam by:: Evelene Roussin, CNM  Prenatal labs: ABO, Rh: --/--/A POS (01/04 1620) Antibody: NEG (01/04 1620) Rubella: 1.62 (05/22 0000) RPR: NON-REACTIVE (10/24 0836)  HBsAg: NON-REACTIVE (05/22 0000)  HIV: NON-REACTIVE (10/24 0836)  GBS:  NEGATIVE Glucola: Normal Genetic screening:  Low-Risk Female  Prenatal Transfer Tool  Maternal Diabetes: No Genetic Screening: Normal Maternal Ultrasounds/Referrals: Normal Fetal Ultrasounds or other Referrals:  None Maternal Substance Abuse:  No Significant Maternal Medications:  None Significant Maternal Lab Results: Group B Strep negative  Results for orders placed or performed during the hospital encounter of 04/22/22 (from the past 24 hour(s))  CBC with Differential/Platelet   Collection Time: 04/22/22  4:20 PM  Result Value Ref Range   WBC 10.5 4.0 - 10.5 K/uL   RBC 4.35 3.87 - 5.11 MIL/uL   Hemoglobin 13.7 12.0 - 15.0 g/dL   HCT 42.3 53.6 - 14.4 %   MCV 91.3 80.0 - 100.0 fL   MCH 31.5 26.0 - 34.0 pg   MCHC 34.5 30.0 - 36.0 g/dL   RDW 31.5 40.0 - 86.7 %   Platelets 234 150 - 400 K/uL   nRBC 0.0 0.0 - 0.2 %   Neutrophils Relative % 64 %   Neutro Abs 6.8 1.7 - 7.7 K/uL   Lymphocytes Relative 28 %   Lymphs Abs 2.9 0.7 - 4.0 K/uL   Monocytes Relative 5 %   Monocytes Absolute 0.6 0.1 - 1.0 K/uL   Eosinophils Relative 2 %   Eosinophils Absolute 0.2 0.0 - 0.5 K/uL   Basophils Relative 0 %   Basophils Absolute 0.0 0.0 - 0.1 K/uL   Immature Granulocytes 1 %   Abs Immature Granulocytes 0.10 (H) 0.00 - 0.07 K/uL  Comprehensive metabolic panel    Collection Time: 04/22/22  4:20 PM  Result Value Ref Range   Sodium 137 135 - 145 mmol/L   Potassium 3.8 3.5 - 5.1 mmol/L   Chloride 108 98 -  111 mmol/L   CO2 17 (L) 22 - 32 mmol/L   Glucose, Bld 88 70 - 99 mg/dL   BUN 6 6 - 20 mg/dL   Creatinine, Ser 0.65 0.44 - 1.00 mg/dL   Calcium 9.1 8.9 - 10.3 mg/dL   Total Protein 6.1 (L) 6.5 - 8.1 g/dL   Albumin 3.1 (L) 3.5 - 5.0 g/dL   AST 35 15 - 41 U/L   ALT 20 0 - 44 U/L   Alkaline Phosphatase 142 (H) 38 - 126 U/L   Total Bilirubin 0.9 0.3 - 1.2 mg/dL   GFR, Estimated >60 >60 mL/min   Anion gap 12 5 - 15  Type and screen [place]   Collection Time: 04/22/22  4:20 PM  Result Value Ref Range   ABO/RH(D) A POS    Antibody Screen NEG    Sample Expiration      04/25/2022,2359 Performed at [facility], [place] [address] [place], [place] [address]     Patient Active Problem List   Diagnosis Date Noted   Indication for care in labor or delivery 04/22/2022   Supervision of other normal pregnancy, antepartum 09/07/2021   Family history of breast cancer in mother 09/07/2021   History of cervical LEEP biopsy affecting care of mother, antepartum 09/07/2021   GERD (gastroesophageal reflux disease) 01/22/2014   Migraine without aura 11/20/2013    Assessment/Plan:  Wyoma Genson is a 32 y.o. G2P1001 at [redacted]w[redacted]d here for IOL d/t gHTN  Labor: IOL -- pain control: planning epidural prn  Fetal Wellbeing: EFW 7lbs by Leopold's. Cephalic by SVE.  -- GBS (Negative) -- continuous fetal monitoring - category 1   Postpartum Planning -- breast -- PoPs for contraception    Laury Deep, CNM  04/22/2022, 2:50 PM

## 2022-04-22 NOTE — Anesthesia Preprocedure Evaluation (Signed)
Anesthesia Evaluation  Patient identified by MRN, date of birth, ID band Patient awake    Reviewed: Allergy & Precautions, H&P , NPO status , Patient's Chart, lab work & pertinent test results, reviewed documented beta blocker date and time   Airway Mallampati: II  TM Distance: >3 FB Neck ROM: full    Dental no notable dental hx.    Pulmonary neg pulmonary ROS, former smoker   Pulmonary exam normal breath sounds clear to auscultation       Cardiovascular hypertension, negative cardio ROS Normal cardiovascular exam Rhythm:regular Rate:Normal     Neuro/Psych  Headaches negative neurological ROS  negative psych ROS   GI/Hepatic negative GI ROS, Neg liver ROS,GERD  Medicated,,  Endo/Other  negative endocrine ROS    Renal/GU negative Renal ROS  negative genitourinary   Musculoskeletal   Abdominal   Peds  Hematology negative hematology ROS (+)   Anesthesia Other Findings   Reproductive/Obstetrics (+) Pregnancy                             Anesthesia Physical Anesthesia Plan  ASA: 2  Anesthesia Plan: Epidural   Post-op Pain Management: Minimal or no pain anticipated   Induction: Intravenous  PONV Risk Score and Plan: 2 and Treatment may vary due to age or medical condition  Airway Management Planned: Natural Airway  Additional Equipment: None  Intra-op Plan:   Post-operative Plan:   Informed Consent: I have reviewed the patients History and Physical, chart, labs and discussed the procedure including the risks, benefits and alternatives for the proposed anesthesia with the patient or authorized representative who has indicated his/her understanding and acceptance.       Plan Discussed with: Anesthesiologist and CRNA  Anesthesia Plan Comments:        Anesthesia Quick Evaluation

## 2022-04-22 NOTE — Progress Notes (Signed)
Patient ID: Stephanie Pratt, female   DOB: 22-Nov-1990, 32 y.o.   MRN: [id number] Just got epidural  Vitals:   04/22/22 2145 04/22/22 2150 04/22/22 2155 04/22/22 2201  BP: 132/82 116/72 122/74 115/73  Pulse: (!) 112 (!) 109 (!) 108 (!) 106  Resp: 18     Temp: 97.9 F (36.6 C)     TempSrc: Oral      FHR 135 with average variability, + acdels, occasional variable decels  Last Cervical exam: Dilation: 3 Effacement (%): 80 Cervical Position: Posterior Station: -2 Exam by:: rolitta, CNM  Will continue to observe

## 2022-04-22 NOTE — Anesthesia Procedure Notes (Signed)
Epidural Patient location during procedure: OB Start time: 04/22/2022 9:35 PM End time: 04/22/2022 9:40 PM  Staffing Anesthesiologist: Janeece Riggers, MD  Preanesthetic Checklist Completed: patient identified, IV checked, site marked, risks and benefits discussed, surgical consent, monitors and equipment checked, pre-op evaluation and timeout performed  Epidural Patient position: sitting Prep: DuraPrep and site prepped and draped Patient monitoring: continuous pulse ox and blood pressure Approach: midline Location: L3-L4 Injection technique: LOR air  Needle:  Needle type: Tuohy  Needle gauge: 17 G Needle length: 9 cm and 9 Needle insertion depth: 6 cm Catheter type: closed end flexible Catheter size: 19 Gauge Catheter at skin depth: 12 cm Test dose: negative  Assessment Events: blood not aspirated, no cerebrospinal fluid, injection not painful, no injection resistance, no paresthesia and negative IV test

## 2022-04-23 ENCOUNTER — Encounter (HOSPITAL_COMMUNITY): Payer: Self-pay | Admitting: Family Medicine

## 2022-04-23 ENCOUNTER — Other Ambulatory Visit: Payer: Self-pay

## 2022-04-23 DIAGNOSIS — O134 Gestational [pregnancy-induced] hypertension without significant proteinuria, complicating childbirth: Secondary | ICD-10-CM

## 2022-04-23 DIAGNOSIS — Z3A38 38 weeks gestation of pregnancy: Secondary | ICD-10-CM

## 2022-04-23 LAB — RPR: RPR Ser Ql: NONREACTIVE

## 2022-04-23 MED ORDER — PRENATAL MULTIVITAMIN CH
1.0000 | ORAL_TABLET | Freq: Every day | ORAL | Status: DC
  Administered 2022-04-23 – 2022-04-25 (×3): 1 via ORAL
  Filled 2022-04-23 (×3): qty 1

## 2022-04-23 MED ORDER — ACETAMINOPHEN 325 MG PO TABS: 650.0000 mg | ORAL_TABLET | ORAL | Status: DC | PRN

## 2022-04-23 MED ORDER — ONDANSETRON HCL 4 MG PO TABS: 4.0000 mg | ORAL_TABLET | ORAL | Status: DC | PRN

## 2022-04-23 MED ORDER — TETANUS-DIPHTH-ACELL PERTUSSIS 5-2.5-18.5 LF-MCG/0.5 IM SUSY: 0.5000 mL | PREFILLED_SYRINGE | Freq: Once | INTRAMUSCULAR | Status: DC

## 2022-04-23 MED ORDER — DIBUCAINE (PERIANAL) 1 % EX OINT: 1.0000 | TOPICAL_OINTMENT | CUTANEOUS | Status: DC | PRN

## 2022-04-23 MED ORDER — ONDANSETRON HCL 4 MG/2ML IJ SOLN: 4.0000 mg | INTRAMUSCULAR | Status: DC | PRN

## 2022-04-23 MED ORDER — ZOLPIDEM TARTRATE 5 MG PO TABS: 5.0000 mg | ORAL_TABLET | Freq: Every evening | ORAL | Status: DC | PRN

## 2022-04-23 MED ORDER — SIMETHICONE 80 MG PO CHEW: 80.0000 mg | CHEWABLE_TABLET | ORAL | Status: DC | PRN

## 2022-04-23 MED ORDER — COCONUT OIL OIL: 1.0000 | TOPICAL_OIL | Status: DC | PRN

## 2022-04-23 MED ORDER — DIPHENHYDRAMINE HCL 25 MG PO CAPS: 25.0000 mg | ORAL_CAPSULE | Freq: Four times a day (QID) | ORAL | Status: DC | PRN

## 2022-04-23 MED ORDER — SENNOSIDES-DOCUSATE SODIUM 8.6-50 MG PO TABS
2.0000 | ORAL_TABLET | ORAL | Status: DC
  Administered 2022-04-23 – 2022-04-25 (×3): 2 via ORAL
  Filled 2022-04-23 (×3): qty 2

## 2022-04-23 MED ORDER — WITCH HAZEL-GLYCERIN EX PADS: 1.0000 | MEDICATED_PAD | CUTANEOUS | Status: DC | PRN

## 2022-04-23 MED ORDER — BENZOCAINE-MENTHOL 20-0.5 % EX AERO
1.0000 | INHALATION_SPRAY | CUTANEOUS | Status: DC | PRN
  Administered 2022-04-23: 1 via TOPICAL
  Filled 2022-04-23: qty 56

## 2022-04-23 MED ORDER — IBUPROFEN 600 MG PO TABS
600.0000 mg | ORAL_TABLET | Freq: Four times a day (QID) | ORAL | Status: DC
  Administered 2022-04-23 – 2022-04-25 (×9): 600 mg via ORAL
  Filled 2022-04-23 (×9): qty 1

## 2022-04-23 NOTE — Discharge Summary (Signed)
Postpartum Discharge Summary  Date of Service updated***     Patient Name: Stephanie Pratt DOB: 29-Jan-1991 MRN: [id number]  Date of admission: 04/22/2022 Delivery date:04/23/2022  Delivering provider: Seabron Spates  Date of discharge: 04/23/2022  Admitting diagnosis: Indication for care in labor or delivery [O75.9] Intrauterine pregnancy: [redacted]w[redacted]d     Secondary diagnosis:  Principal Problem:   Indication for care in labor or delivery Active Problems:   Gestational hypertension   Vaginal delivery   Shoulder dystocia during labor and delivery, delivered  Additional problems: Shoulder dystocia and LGA infant    Discharge diagnosis: Term Pregnancy Delivered and LGA with shoulder dystocia, 3A mediolateral episiotomy with laceration                                               Post partum procedures: check perineum Augmentation: AROM and Pitocin Complications: Shoulder dystocia                             3A laceration with ML Episiotomy  Hospital course: Induction of Labor With Vaginal Delivery   [name] at [redacted]w[redacted]d was admitted to the hospital 04/22/2022 for induction of labor.  Indication for induction: Gestational hypertension.  Patient had an labor course complicated by LGA and shoulder dystocia Membrane Rupture Time/Date: 7:00 AM ,04/22/2022   Delivery Method:Vaginal, Spontaneous  Episiotomy: Right Mediolateral  Lacerations:  3rd degree  Details of delivery can be found in separate delivery note.  Patient had a postpartum course complicated by***. Patient is discharged home 04/23/22.  Newborn Data: Birth date:04/23/2022  Birth time:7:08 AM  Gender:Female  Living status:Living  Apgars:0 ,3  Weight:4110 g   Magnesium Sulfate received: {Mag received:30440022} BMZ received: {BMZ received:30440023} Rhophylac:{Rhophylac received:30440032} KXF:{GHW:29937169} T-DaP:{Tdap:23962} Flu: {CVE:93810} Transfusion:{Transfusion received:30440034}  Physical exam  Vitals:    04/23/22 0500 04/23/22 0530 04/23/22 0746 04/23/22 0801  BP: 131/86 135/86 113/61 113/74  Pulse: (!) 125 (!) 129 (!) 125 (!) 130  Resp:   18 16  Temp: 99 F (37.2 C)     TempSrc: Oral     SpO2:       General: {Exam; general:21111117} Lochia: {Desc; appropriate/inappropriate:30686::"appropriate"} Uterine Fundus: {Desc; firm/soft:30687} Incision: {Exam; incision:21111123} DVT Evaluation: {Exam; dvt:2111122} Labs: Lab Results  Component Value Date   WBC 10.5 04/22/2022   HGB 13.7 04/22/2022   HCT 39.7 04/22/2022   MCV 91.3 04/22/2022   PLT 234 04/22/2022      Latest Ref Rng & Units 04/22/2022    4:20 PM  CMP  Glucose 70 - 99 mg/dL 88   BUN 6 - 20 mg/dL 6   Creatinine 0.44 - 1.00 mg/dL 0.65   Sodium 135 - 145 mmol/L 137   Potassium 3.5 - 5.1 mmol/L 3.8   Chloride 98 - 111 mmol/L 108   CO2 22 - 32 mmol/L 17   Calcium 8.9 - 10.3 mg/dL 9.1   Total Protein 6.5 - 8.1 g/dL 6.1   Total Bilirubin 0.3 - 1.2 mg/dL 0.9   Alkaline Phos 38 - 126 U/L 142   AST 15 - 41 U/L 35   ALT 0 - 44 U/L 20    Edinburgh Score:     No data to display            After visit meds:  Allergies  as of 04/23/2022   No Known Allergies   Med Rec must be completed prior to using this [facility]***        Discharge home in stable condition Infant Feeding: Breast Infant Disposition:NICU Discharge instruction: per After Visit Summary and Postpartum booklet. Activity: Advance as tolerated. Pelvic rest for 6 weeks.  Diet: routine diet Anticipated Birth Control: {Birth Control:23956} Postpartum Appointment:{Outpatient follow up:23559} Additional Postpartum F/U: {PP Procedure:23957} Future Appointments: Future Appointments  Date Time Provider [place]  04/29/2022 11:10 AM Radene Gunning, MD [facility]  06/03/2022  9:30 AM Radene Gunning, MD [facility]   Follow up Visit:      04/23/2022 Hansel Feinstein, CNM

## 2022-04-23 NOTE — Progress Notes (Signed)
Patient ID: Stephanie Pratt, female   DOB: [birth year], 32 y.o.   MRN: [id number] Has been pushing for 97min or so  Vertex still at 0 station.  FHR reactive with intermittent variable decels  Will continue second stage

## 2022-04-23 NOTE — Lactation Note (Signed)
This note was copied from a baby's chart.  NICU Lactation Consultation Note  Patient Name: Stephanie Pratt Date: 04/23/2022 Age:34 hours  Subjective Reason for consult: Initial assessment; NICU baby; Early term 37-38.6wks; Other (Comment) (LGA)  Visited with family of 68 hours old [name] female, Ms. Drawdy is a P2 and experienced breastfeeding. She started pumping already and getting small droplets of colostrum, praised her for her efforts. Baby is currently on cooling blanket and unable to do STS with parents, but as his status progresses, parents willing to enable STS in his care plan. Reviewed pumping schedule, lactogenesis II and anticipatory guidelines.   Objective Infant data: Mother's Current Feeding Choice: -- (NPO)  Maternal data: [id number]  Vaginal, Spontaneous Significant Breast History:: moderate breast changes during the pregnancy Current breast feeding challenges:: NICU admission Previous breastfeeding challenges?: Infant separation (She had to go back to work) Does the patient have breastfeeding experience prior to this delivery?: Yes How long did the patient breastfeed?: 9 months Pumping frequency: q 3 hours (recommended) Pumped volume: 0 mL (droplets) Flange Size: 24 Risk factor for low milk supply:: infant separation Pump: Personal (Lansinoh DEBP)  Assessment Infant: Feeding Status: NPO  Maternal: Milk volume: Normal  Intervention/Plan Interventions: Breast feeding basics reviewed; Coconut oil; DEBP; Education; [employer]; Infant Driven Feeding Algorithm education Tools: Pump; Flanges; Coconut oil Pump Education: Setup, frequency, and cleaning; Milk Storage  Plan of care: Encouraged pumping every 3 hours, ideally 8 pumping sessions/24 hours Hand expression and coconut oil were also encouraged prior pumping  FOB present. All questions and concerns answered, family to contact [facility] services PRN.  Consult Status: NICU  follow-up  NICU Follow-up type: Maternal D/C visit; New admission follow up   [place] 04/23/2022, 4:27 PM

## 2022-04-23 NOTE — Plan of Care (Signed)

## 2022-04-23 NOTE — Progress Notes (Signed)
Patient ID: Stephanie Pratt, female   DOB: 08/19/1990, 32 y.o.   MRN: [id number] Starting to feel some pressure  Vitals:   04/23/22 0230 04/23/22 0300 04/23/22 0330 04/23/22 0400  BP: 134/89 136/85 118/70 123/71  Pulse: (!) 104 (!) 123 (!) 122 (!) 111  Resp:  17    Temp:   99.2 F (37.3 C)   TempSrc:   Oral   SpO2:       FHR reassuring UCs q66min  Dilation: Lip/rim Effacement (%): 100 Cervical Position: Posterior Station: -2, -1 Presentation: Vertex Exam by:: Smalley RN  Anticipate SVD soon

## 2022-04-24 MED ORDER — FUROSEMIDE 20 MG PO TABS
20.0000 mg | ORAL_TABLET | Freq: Every day | ORAL | Status: DC
  Administered 2022-04-24 – 2022-04-25 (×2): 20 mg via ORAL
  Filled 2022-04-24 (×2): qty 1

## 2022-04-24 NOTE — Lactation Note (Signed)
This note was copied from a baby's chart.  NICU Lactation Consultation Note  Patient Name: Stephanie Pratt [id number] Date: 04/24/2022 Age:34 hours  Subjective Reason for consult: Follow-up assessment; NICU baby; Early term 77-38.6wks; Other (Comment) (LGA)  Visited with family of 53 hours old [name] female; Ms. Hulon is a P2 and experienced breastfeeding. She continues pumping and trying to follow the 3 hours schedule, although it's been challenging. This is the first time she was holding baby; she voiced she feels motivated because her volumes have increased; praised her for her efforts. Revised pump settings, lactogenesis II and anticipatory guidelines.  Objective Infant data: Mother's Current Feeding Choice: -- (NPO)  Maternal data: [id number]  Vaginal, Spontaneous Significant Breast History:: moderate breast changes during the pregnancy Current breast feeding challenges:: NICU admission Previous breastfeeding challenges?: Infant separation (She had to go back to work) Does the patient have breastfeeding experience prior to this delivery?: Yes How long did the patient breastfeed?: 9 months Pumping frequency: 5-6 times/24 hours Pumped volume: 14 mL Flange Size: 24 Risk factor for low milk supply:: infant separation Pump: Personal (Lansinoh DEBP)  Assessment Infant: Feeding Status: NPO  Maternal: Milk volume: Normal  Intervention/Plan Interventions: Breast feeding basics reviewed; Coconut oil; DEBP; Education; [employer]; Infant Driven Feeding Algorithm education Tools: Pump; Flanges; Coconut oil Pump Education: Setup, frequency, and cleaning; Milk Storage  Plan of care: Encouraged pumping every 3 hours, ideally 8 pumping sessions/24 hours Hand expression and coconut oil were also encouraged prior pumping; and her EBM for post-pumping She'll switch her pump settings from initiation to expression mode once she starts getting 20 ml of EBM combined   Baby's  uncle (mom's sister) present. All questions and concerns answered, family to contact [facility] services PRN.  Consult Status: NICU follow-up  NICU Follow-up type: Maternal D/C visit; Verify onset of copious milk; Verify absence of engorgement   Stephanie Pratt 04/24/2022, 5:13 PM

## 2022-04-24 NOTE — Progress Notes (Signed)
Post Partum Day 1 Subjective: no complaints, up ad lib, voiding, and tolerating PO  Objective: Blood pressure 123/74, pulse 77, temperature 98 F (36.7 C), temperature source Oral, resp. rate 18, last menstrual period 07/21/2021, SpO2 98 %, unknown if currently breastfeeding.  Physical Exam:  General: alert, cooperative, and appears stated age Lochia: appropriate Uterine Fundus: firm Incision: healing well DVT Evaluation: No evidence of DVT seen on physical exam. Calf/Ankle edema is present.  Recent Labs    04/22/22 1620  HGB 13.7  HCT 39.7    Assessment/Plan: Plan for discharge tomorrow, Breastfeeding, Circumcision prior to discharge, and Contraception POPs Lasix today for swelling   LOS: 2 days   Donnamae Jude, MD 04/24/2022, 11:02 AM

## 2022-04-24 NOTE — Anesthesia Postprocedure Evaluation (Signed)
Anesthesia Post Note  Patient: Stephanie Pratt  Procedure(s) Performed: AN AD HOC LABOR EPIDURAL     Patient location during evaluation: Mother Baby Anesthesia Type: Epidural Level of consciousness: awake and alert Pain management: pain level controlled Vital Signs Assessment: post-procedure vital signs reviewed and stable Respiratory status: spontaneous breathing, nonlabored ventilation and respiratory function stable Cardiovascular status: stable Postop Assessment: no headache, no backache and epidural receding Anesthetic complications: no   No notable events documented.  Last Vitals:  Vitals:   04/24/22 0745 04/24/22 1125  BP: 123/74 108/69  Pulse: 77 90  Resp: 18 18  Temp: 36.7 C 36.7 C  SpO2: 98% 97%    Last Pain:  Vitals:   04/24/22 1125  TempSrc: Oral  PainSc:    Pain Goal: Patients Stated Pain Goal: 3 (04/23/22 2018)                 Birdena Crandall, Velvet B[place]

## 2022-04-24 NOTE — Plan of Care (Signed)
  Problem: Health Behavior/Discharge Planning: Goal: Ability to manage health-related needs will improve Outcome: Progressing   Problem: Clinical Measurements: Goal: Ability to maintain clinical measurements within normal limits will improve Outcome: Progressing Goal: Will remain free from infection Outcome: Progressing Goal: Diagnostic test results will improve Outcome: Progressing Goal: Respiratory complications will improve Outcome: Progressing Goal: Cardiovascular complication will be avoided Outcome: Progressing   Problem: Activity: Goal: Risk for activity intolerance will decrease Outcome: Progressing   Problem: Nutrition: Goal: Adequate nutrition will be maintained Outcome: Progressing   Problem: Coping: Goal: Level of anxiety will decrease Outcome: Progressing   Problem: Pain Managment: Goal: General experience of comfort will improve Outcome: Progressing   Problem: Safety: Goal: Ability to remain free from injury will improve Outcome: Progressing   Problem: Skin Integrity: Goal: Risk for impaired skin integrity will decrease Outcome: Progressing   Problem: Coping: Goal: Ability to identify and utilize available resources and services will improve Outcome: Progressing   Problem: Life Cycle: Goal: Chance of risk for complications during the postpartum period will decrease Outcome: Progressing   Problem: Role Relationship: Goal: Ability to demonstrate positive interaction with newborn will improve Outcome: Progressing   Problem: Skin Integrity: Goal: Demonstration of wound healing without infection will improve Outcome: Progressing

## 2022-04-25 MED ORDER — NORETHINDRONE 0.35 MG PO TABS: 1.0000 | ORAL_TABLET | Freq: Every day | ORAL | 3 refills | Status: DC

## 2022-04-25 MED ORDER — DOCUSATE SODIUM 100 MG PO CAPS: 100.0000 mg | ORAL_CAPSULE | Freq: Two times a day (BID) | ORAL | 1 refills | Status: DC

## 2022-04-25 MED ORDER — IBUPROFEN 600 MG PO TABS: 600.0000 mg | ORAL_TABLET | Freq: Four times a day (QID) | ORAL | 0 refills | Status: AC

## 2022-04-25 MED ORDER — FUROSEMIDE 20 MG PO TABS: 20.0000 mg | ORAL_TABLET | Freq: Every day | ORAL | 0 refills | Status: DC

## 2022-04-25 NOTE — Plan of Care (Signed)
  Problem: Health Behavior/Discharge Planning: Goal: Ability to manage health-related needs will improve Outcome: Adequate for Discharge   Problem: Clinical Measurements: Goal: Ability to maintain clinical measurements within normal limits will improve Outcome: Adequate for Discharge Goal: Will remain free from infection Outcome: Adequate for Discharge Goal: Diagnostic test results will improve Outcome: Adequate for Discharge Goal: Respiratory complications will improve Outcome: Adequate for Discharge Goal: Cardiovascular complication will be avoided Outcome: Adequate for Discharge   Problem: Activity: Goal: Risk for activity intolerance will decrease Outcome: Adequate for Discharge   Problem: Nutrition: Goal: Adequate nutrition will be maintained Outcome: Adequate for Discharge   Problem: Coping: Goal: Level of anxiety will decrease Outcome: Adequate for Discharge   Problem: Pain Managment: Goal: General experience of comfort will improve Outcome: Adequate for Discharge   Problem: Safety: Goal: Ability to remain free from injury will improve Outcome: Adequate for Discharge   Problem: Skin Integrity: Goal: Risk for impaired skin integrity will decrease Outcome: Adequate for Discharge   Problem: Coping: Goal: Ability to identify and utilize available resources and services will improve Outcome: Adequate for Discharge   Problem: Life Cycle: Goal: Chance of risk for complications during the postpartum period will decrease Outcome: Adequate for Discharge   Problem: Role Relationship: Goal: Ability to demonstrate positive interaction with newborn will improve Outcome: Adequate for Discharge   Problem: Skin Integrity: Goal: Demonstration of wound healing without infection will improve Outcome: Adequate for Discharge

## 2022-04-25 NOTE — Discharge Instructions (Signed)
You had a small tear to the rectal sphincter with delivery. Please try to not to strain with bowel movements. We will give you a stool softener. Please do not put anything in the rectum.  Please let us know if you are having any incontinence of stool or gas.

## 2022-04-26 NOTE — Progress Notes (Signed)
Patient screened out for psychosocial assessment since none of the following apply: °Psychosocial stressors documented in mother or baby's chart °Gestation less than 32 weeks °Code at delivery  °Infant with anomalies °Please contact the Clinical Social Worker if specific needs arise, by MOB's request, or if MOB scores greater than 9/yes to question 10 on Edinburgh Postpartum Depression Screen. ° °Ginni Eichler Boyd-Gilyard, MSW, LCSW °Clinical Social Work °[phone] °  °

## 2022-04-27 ENCOUNTER — Ambulatory Visit: Payer: Self-pay

## 2022-04-27 NOTE — Lactation Note (Signed)
This note was copied from a baby's chart.  NICU Lactation Consultation Note  Patient Name: Stephanie Pratt DPOEU'M Date: 04/27/2022 Age:34 days  Subjective Reason for consult: Follow-up assessment; NICU baby; Early term 74-38.6wks; Other (Comment) (LGA)  Visited with parents of 92 days old [name] female; Ms. Stephanie Pratt was discharged two days ago and reports that her milk is in.  She voiced that her L breast is fine but her right one could get some knots that require massage. Reviewed discharge education, pump settings and anticipatory guidelines. Baby might start NG feedings today, Ms. Stephanie Pratt is excited that we'll be able to finally use her breastmilk, she already has a "stash" and plan to start freezing it if needed, she has a deep freezer at home.  Objective Infant data: Mother's Current Feeding Choice: -- (NPO)  Infant feeding assessment Scale for Readiness: 4  Maternal data: G2P2002  Vaginal, Spontaneous Pumping frequency: 7-8 times/24 hours Pumped volume: 120 mL Flange Size: 24 Pump: Personal (Lansinoh DEBP)  Assessment Infant: Feeding Status: NPO  Maternal: Milk volume: Abundant  Intervention/Plan Interventions: Breast feeding basics reviewed; DEBP; Coconut oil; Education Tools: Pump; Flanges; Coconut oil Pump Education: Setup, frequency, and cleaning; Milk Storage  Plan of care: Encouraged to continue pumping every 3 hours, ideally 8 pumping sessions/24 hours She'll start icing her breast as needed    FOB present and supportive. All questions and concerns answered, family to contact [facility] services PRN.  Consult Status: NICU follow-up  NICU Follow-up type: Weekly NICU follow up   [name] 04/27/2022, 10:56 AM

## 2022-04-28 ENCOUNTER — Encounter: Payer: Self-pay | Admitting: Obstetrics and Gynecology

## 2022-04-29 ENCOUNTER — Encounter: Payer: Self-pay | Admitting: Obstetrics and Gynecology

## 2022-04-29 ENCOUNTER — Telehealth (INDEPENDENT_AMBULATORY_CARE_PROVIDER_SITE_OTHER): Payer: Medicaid Other | Admitting: Obstetrics and Gynecology

## 2022-04-29 VITALS — BP 141/90

## 2022-04-29 DIAGNOSIS — Z0130 Encounter for examination of blood pressure without abnormal findings: Secondary | ICD-10-CM

## 2022-04-29 NOTE — Progress Notes (Signed)
    GYNECOLOGY VIRTUAL VISIT ENCOUNTER NOTE  Provider location: [facility] [place] [facility] [place]   Patient location: Home  I connected with Stephanie Pratt on 04/29/32 at 11:10 AM EST by MyChart Video Encounter and verified that I am speaking with the correct person using two identifiers.   I discussed the limitations, risks, security and privacy concerns of performing an evaluation and management service virtually and the availability of in person appointments. I also discussed with the patient that there may be a patient responsible charge related to this service. The patient expressed understanding and agreed to proceed.   History:  Stephanie Pratt is a 32 y.o. G5P2002 female being evaluated today for BP check. Pregnancy was complicated by elevated blood pressures (GHTN). She is on lasix. She has no symptoms. Her swelling is improved on Lasix.    Her delivery was complicated by a 5 minutes shoulder dystocia on 04/23/22. The baby has been in the NICU since that time. He is being weaned off oxygen and she was able to hold him today.   Review of Systems:  Pertinent items noted in HPI and remainder of comprehensive ROS otherwise negative.  Physical Exam:   Today's Vitals   04/29/22 1102 04/29/22 1103  BP: (!) 140/90 (!) 141/90   There is no height or weight on file to calculate BMI.  General:  Alert, oriented and cooperative. Patient appears to be in no acute distress.  Mental Status: Normal mood and affect. Normal behavior. Normal judgment and thought content.   Respiratory: Normal respiratory effort, no problems with respiration noted  Rest of physical exam deferred due to type of encounter  Assessment and Plan:     1. Blood pressure check Bps prior to today have been normal. Since today is first elevation we will monitor but if she notices a trend that is persistent 140s/90s or she notices they are higher, she will send [place] a mychart message and we would start  medicine at that time.       I discussed the assessment and treatment plan with the patient. The patient was provided an opportunity to ask questions and all were answered. The patient agreed with the plan and demonstrated an understanding of the instructions.   The patient was advised to call back or seek an in-person evaluation/go to the ED if the symptoms worsen or if the condition fails to improve as anticipated.  I provided 3 minutes of face-to-face time during this encounter.   Radene Gunning, MD [facility] for [place], [place]

## 2022-05-02 ENCOUNTER — Ambulatory Visit: Payer: Self-pay

## 2022-05-02 NOTE — Lactation Note (Signed)
This note was copied from a baby's chart.  NICU Lactation Consultation Note  Patient Name: Stephanie Pratt NIDPO'E Date: 05/02/2022 Age:34 days  Subjective Reason for consult: Follow-up assessment; NICU baby; Early term 52-38.6wks; Other (Comment) (LGA)  Visited with parents of 47 days old [name] female, Stephanie Pratt is a P2 and experienced breastfeeding. She endorses breast changes on her R breast, she voiced the knots are almost gone and it feels much better now. She's also happy to report that her supply continues to increase, baby has already started to PO feed last night with a Dr. Owens Shark ultra preemie nipple and took an entire bottle. Provided additonal bottles for storage and also 8 oz. bottles for pumping. Her plan is to do both direct breastfeeding and to continue working on bottle feedings. Asked her to call for latch assistance when needed.  Objective Infant data: Mother's Current Feeding Choice: Breast Milk  Infant feeding assessment Scale for Readiness: 2 Scale for Quality: 2  Maternal data: [id number]  Vaginal, Spontaneous Pumping frequency: 8-10 times/24 hours Pumped volume: 180 mL (180 ml if pumping q 2 hours and 300 ml if pumping q 3 hours) Flange Size: 24 Pump: Personal (Lansinoh DEBP)  Assessment Infant: In NICU  Maternal: Milk volume: Abundant  Intervention/Plan Interventions: Breast feeding basics reviewed; Coconut oil; DEBP; Infant Driven Feeding Algorithm education Tools: Pump; Flanges; Coconut oil; Other (comment) (8 oz. bottles) Pump Education: Setup, frequency, and cleaning; Milk Storage  Plan of care: Encouraged to continue pumping every 3 hours, ideally 8 pumping sessions/24 hours She'll start taking baby to breast on feeding cues around feeding times; will need to pre-pump for 2-5 minutes due to maternal supply Parents will continue working on bottle feedings   FOB present and supportive. All questions and concerns answered, family to  contact [facility] services PRN.  Consult Status: NICU follow-up  NICU Follow-up type: Weekly NICU follow up; Assist with IDF-1 (Mother to pre-pump before breastfeeding) (just pump for comfort due to supply)   Stephanie Pratt 05/02/2022, 11:29 AM

## 2022-05-03 ENCOUNTER — Telehealth (HOSPITAL_COMMUNITY): Payer: Self-pay | Admitting: *Deleted

## 2022-05-03 ENCOUNTER — Other Ambulatory Visit: Payer: Self-pay | Admitting: Family Medicine

## 2022-05-03 NOTE — Telephone Encounter (Signed)
Mom reports feeling good. No concerns about herself at this time. EPDS=6 [facility] score=6) Mom reports baby is doing well in the NICU.  Odis Hollingshead, RN 05-03-2022 at 9:53am

## 2022-05-06 ENCOUNTER — Encounter: Payer: Medicaid Other | Admitting: Obstetrics and Gynecology

## 2022-05-06 ENCOUNTER — Ambulatory Visit: Payer: Self-pay

## 2022-05-06 NOTE — Lactation Note (Signed)
This note was copied from a baby's chart.  NICU Lactation Consultation Note  Patient Name: Stephanie Pratt OEVOJ'J Date: 05/06/2022 Age:34 days  Subjective Reason for consult: Follow-up assessment; NICU baby; Other (Comment); Early term 48-38.6wks (LGA)  Visited with family of 13 days olf [name] female; [name] is a P2 and experienced breastfeeding. She reports that pumping is going well; she continues pumping consistently and her volumes continue to increase, praised her for her efforts. She has also been taking baby "Stephanie Pratt" to breast when she came to visit about twice/24 hours with documented LATCH scores of 10. Ms. Magri is taking baby Stephanie Pratt home today. Reviewed discharge education, lactogenesis III, anticipatory guidelines and the importance of continue pumping after feedings at the breast for the prevention of engorgement and to protect her supply; she has a robust supply but might seek help to down-regulate if needed. She politely declined a referral for [facility] but has their phone number to call them in case is needed. No other support person at this time, all questions and concerns answered, family to contact [facility] services PRN.  Objective Infant data: Mother's Current Feeding Choice: Breast Milk  Infant feeding assessment Scale for Readiness: 2 Scale for Quality: 2  Maternal data: [id number]  Vaginal, Spontaneous Pumping frequency: 6 times/24 hours Pumped volume: 360 mL Flange Size: 24 Pump: Personal (Lansinoh DEBP)  Assessment Infant: LATCH Score: 10 Per RN  Feeding Status: Ad lib  Maternal: Milk volume: Abundant  Intervention/Plan Interventions: Breast feeding basics reviewed; DEBP; Education Tools: Pump; Flanges; Other (comment) (8 oz. bottles) Pump Education: Setup, frequency, and cleaning; Milk Storage  Plan: Consult Status: Complete   Stephanie Pratt 05/06/2022, 4:26 PM

## 2022-06-02 NOTE — Progress Notes (Unsigned)
[facility] Partum Visit Note  Stephanie Pratt is a 32 y.o. G86P2002 female who presents for a postpartum visit. She is 6 weeks postpartum following a normal spontaneous vaginal delivery.  I have fully reviewed the prenatal and intrapartum course. The delivery was at 38.5 gestational weeks.  Anesthesia: epidural. Postpartum course has been unremarkable. Baby is doing well after coming out of NICU. Baby is feeding by breast. Bleeding no bleeding. Bowel function is normal. Bladder function is normal. Patient is not sexually active. Contraception method is oral progesterone-only contraceptive. Postpartum depression screening: negative.  She is doing well now mentally. She had some PTSD type symptoms when her son was in the NICU but better now. She declines therapy at this time but will call if she starts getting worse instead of better.   Her son is doing well and is home with them. He was in the NICU until about 1/18.   The pregnancy intention screening data noted above was reviewed. Potential methods of contraception were discussed. The patient elected to proceed with No data recorded.   Edinburgh Postnatal Depression Scale - 06/02/22 1514       Edinburgh Postnatal Depression Scale:  In the Past 7 Days   I have been able to laugh and see the funny side of things. 0    I have looked forward with enjoyment to things. 0    I have blamed myself unnecessarily when things went wrong. 0    I have been anxious or worried for no good reason. 1    I have felt scared or panicky for no good reason. 0    Things have been getting on top of me. 1    I have been so unhappy that I have had difficulty sleeping. 0    I have felt sad or miserable. 0    I have been so unhappy that I have been crying. 0    The thought of harming myself has occurred to me. 0    Edinburgh Postnatal Depression Scale Total 2             Health Maintenance Due  Topic Date Due   COVID-19 Vaccine (1) Never done   INFLUENZA  VACCINE  11/17/2021    The following portions of the patient's history were reviewed and updated as appropriate: allergies, current medications, past family history, past medical history, past social history, past surgical history, and problem list.  Review of Systems Pertinent items are noted in HPI.  Objective:  BP 124/85   Pulse 71   Resp 16   Ht 5' 2"$  (1.575 m)   Wt 159 lb (72.1 kg)   BMI 29.08 kg/m    General:  alert, cooperative, and no distress       Assessment:   6 wk postpartum exam.   Plan:   Essential components of care per ACOG recommendations:  1.  Mood and well being: Patient with negative depression screening today. Reviewed local resources for support.  - Patient tobacco use? No.   - hx of drug use? No.    2. Infant care and feeding:  -Patient currently breastmilk feeding? Yes. Reviewed importance of draining breast regularly to support lactation.  -Social determinants of health (SDOH) reviewed in EPIC. No concerns  3. Sexuality, contraception and birth spacing - Patient does not want a pregnancy in the next year.  Desired family size is 2 children.  - Reviewed reproductive life planning. Reviewed contraceptive methods based on pt preferences and effectiveness.  Patient desired to continue POP today but spouse plans vasectomy.  - We did discuss if pregnancy again, we would offer 1LTCS.  - Discussed birth spacing of 18 months  4. Sleep and fatigue -Encouraged family/partner/community support of 4 hrs of uninterrupted sleep to help with mood and fatigue  5. Physical Recovery  - Discussed patients delivery and complications. She describes her labor as mixed. Her lab was quick and she pushed 30 minutes but then had a significant shoulder dystocia with her son going to the NICU which was traumatic for her. She is doing much better now.  - Patient had a vaginal delivery.. Patient had a  ML episiotomy with 3a  laceration. Perineal healing reviewed. Patient  expressed understanding - Patient has urinary incontinence? No. - Patient is safe to resume physical and sexual activity  6.  Health Maintenance - HM due items addressed Yes - Last pap smear  Diagnosis  Date Value Ref Range Status  11/11/2020   Final   - Negative for intraepithelial lesion or malignancy (NILM)   Pap smear not done at today's visit.  -Breast Cancer screening indicated? No.   7. Chronic Disease/Pregnancy Condition follow up: None  - PCP follow up  Radene Gunning, [place] for [facility], [place]

## 2022-06-03 ENCOUNTER — Ambulatory Visit (INDEPENDENT_AMBULATORY_CARE_PROVIDER_SITE_OTHER): Payer: Medicaid Other | Admitting: Obstetrics and Gynecology

## 2022-06-03 ENCOUNTER — Encounter: Payer: Self-pay | Admitting: Obstetrics and Gynecology

## 2022-06-03 MED ORDER — NORETHINDRONE 0.35 MG PO TABS: 1.0000 | ORAL_TABLET | Freq: Every day | ORAL | 3 refills | Status: DC

## 2023-01-04 ENCOUNTER — Other Ambulatory Visit: Payer: Self-pay | Admitting: *Deleted

## 2023-01-04 MED ORDER — NORETHINDRONE 0.35 MG PO TABS: 1.0000 | ORAL_TABLET | Freq: Every day | ORAL | 1 refills | Status: DC

## 2023-06-07 ENCOUNTER — Ambulatory Visit: Payer: Medicaid Other | Admitting: Obstetrics and Gynecology

## 2023-06-07 ENCOUNTER — Encounter: Payer: Self-pay | Admitting: Obstetrics and Gynecology

## 2023-06-07 VITALS — BP 118/80 | HR 69 | Ht 62.0 in | Wt 153.0 lb

## 2023-06-07 DIAGNOSIS — Z01419 Encounter for gynecological examination (general) (routine) without abnormal findings: Secondary | ICD-10-CM | POA: Diagnosis not present

## 2023-06-07 MED ORDER — NORETHINDRONE 0.35 MG PO TABS: 1.0000 | ORAL_TABLET | Freq: Every day | ORAL | 3 refills | Status: DC

## 2023-06-07 NOTE — Progress Notes (Signed)
 GYNECOLOGY ANNUAL PREVENTATIVE CARE ENCOUNTER NOTE  History:     Annaliza Zia is a 33 y.o. G60P2002 female here for a routine annual gynecologic exam.  Current complaints: None.   Denies abnormal vaginal bleeding, discharge, pelvic pain, problems with intercourse or other gynecologic concerns.    Gynecologic History Patient's last menstrual period was 05/25/2023. Contraception: Micronor Last Pap: 10/2020. 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 Russ Halo  Obstetric History OB History  Gravida Para Term Preterm AB Living  2 2 2   2   SAB IAB Ectopic Multiple Live Births     0 2    # Outcome Date GA Lbr Len/2nd Weight Sex Type Anes PTL Lv  2 Term 04/23/22 [redacted]w[redacted]d 22:55 / 01:13 9 lb 1 oz (4.11 kg) M Vag-Spont EPI  LIV  1 Term 04/30/09   6 lb 7 oz (2.92 kg) F Vag-Spont EPI      Past Medical History:  Diagnosis Date   Abnormal Pap smear of cervix    IBS (irritable bowel syndrome)     Past Surgical History:  Procedure Laterality Date   LAPAROSCOPIC APPENDECTOMY     LEEP     WISDOM TOOTH EXTRACTION      Current Outpatient Medications on File Prior to Visit  Medication Sig Dispense Refill   norethindrone (MICRONOR) 0.35 MG tablet Take 1 tablet (0.35 mg total) by mouth daily. 84 tablet 1   docusate sodium (COLACE) 100 MG capsule Take 1 capsule (100 mg total) by mouth 2 (two) times daily. (Patient not taking: Reported on 06/07/2023) 30 capsule 1   ibuprofen (ADVIL) 600 MG tablet Take 1 tablet (600 mg total) by mouth every 6 (six) hours. (Patient not taking: Reported on 06/07/2023) 30 tablet 0   No current facility-administered medications on file prior to visit.    No Known Allergies  Social  History:  reports that she has quit smoking. Her smoking use included cigarettes. She has a 0.6 pack-year smoking history. She has never used smokeless tobacco. She reports that she does not currently use alcohol. She reports that she does not use drugs.  Family History  Problem Relation Age of Onset   Breast cancer Mother    Diabetes Father    Heart disease Father    Hypertension Father    COPD Father    Congestive Heart Failure Father    Vascular Disease Father    Cancer Maternal Grandmother        breast    The following portions of the patient's history were reviewed and updated as appropriate: allergies, current medications, past family history, past medical history, past social history, past surgical history and problem list.  Review of Systems Pertinent items noted in HPI and remainder of comprehensive ROS otherwise negative.  Physical Exam:  BP 118/80   Pulse 69   Ht 5\' 2"  (1.575 m)   Wt 153 lb (69.4 kg)   LMP 05/25/2023   BMI 27.98 kg/m  CONSTITUTIONAL: Well-developed, well-nourished female in no acute distress.  HENT:  Normocephalic, atraumatic, External right and left ear normal.  EYES: Conjunctivae and EOM are normal. Pupils are equal, round, and reactive to light. No scleral icterus.  NECK: Normal range of motion,  supple, no masses.  Normal thyroid.  SKIN: Skin is warm and dry. No rash noted. Not diaphoretic. No erythema. No pallor. MUSCULOSKELETAL: Normal range of motion. No tenderness.  No cyanosis, clubbing, or edema. NEUROLOGIC: Alert and oriented to person, place, and time. Normal reflexes, muscle tone coordination.  PSYCHIATRIC: Normal mood and affect. Normal behavior. Normal judgment and thought content. CARDIOVASCULAR: Normal heart rate noted, regular rhythm RESPIRATORY: Clear to auscultation bilaterally. Effort and breath sounds normal, no problems with respiration noted. BREASTS: Symmetric in size. No masses, tenderness, skin changes, nipple drainage, or  lymphadenopathy bilaterally. Performed in the presence of a chaperone. ABDOMEN: Soft, no distention noted.  No tenderness, rebound or guarding.  PELVIC: Deferred    Assessment and Plan:   1. Women's annual routine gynecological examination (Primary)  - Ambulatory referral to Genetics  - Reviewed patient with Dr. Para March, will refer her for discussion of (+BRCA)  - One year supply of [facility] sent.    Normal breast examination today, she was advised to perform periodic self breast examinations.  Routine preventative health maintenance measures emphasized. Please refer to After Visit Summary for other counseling recommendations.     Akyia Borelli, Harolyn Rutherford, NP [facility] for [employer], [facility]

## 2023-06-07 NOTE — Progress Notes (Signed)
 Last pap- 11/11/20- neg

## 2023-06-15 ENCOUNTER — Telehealth: Payer: Self-pay | Admitting: *Deleted

## 2023-06-15 NOTE — Telephone Encounter (Signed)
Left patient a message to call and schedule. 

## 2023-06-15 NOTE — Telephone Encounter (Signed)
-----   Message from [facility] sent at 06/14/2023  1:36 PM EST ----- Please scheduled this patient as an established GYN with Dr. Para March to discuss +BRCA  Thank you!!!

## 2023-06-28 ENCOUNTER — Encounter: Payer: Self-pay | Admitting: Genetic Counselor

## 2023-06-28 ENCOUNTER — Inpatient Hospital Stay: Payer: Medicaid Other

## 2023-06-28 ENCOUNTER — Inpatient Hospital Stay: Payer: Medicaid Other | Attending: Nurse Practitioner | Admitting: Genetic Counselor

## 2023-06-28 DIAGNOSIS — Z87891 Personal history of nicotine dependence: Secondary | ICD-10-CM

## 2023-06-28 DIAGNOSIS — Z1589 Genetic susceptibility to other disease: Secondary | ICD-10-CM | POA: Diagnosis not present

## 2023-06-28 DIAGNOSIS — Z803 Family history of malignant neoplasm of breast: Secondary | ICD-10-CM | POA: Diagnosis not present

## 2023-06-28 DIAGNOSIS — Z1379 Encounter for other screening for genetic and chromosomal anomalies: Secondary | ICD-10-CM

## 2023-06-28 NOTE — Progress Notes (Signed)
 REFERRING PROVIDER: Medicine, [facility] No address on file  PRIMARY PROVIDER:  Medicine, [facility]  PRIMARY REASON FOR VISIT:  1. BRIP1 gene mutation positive   2. Family history of malignant neoplasm of breast     HISTORY OF PRESENT ILLNESS:   Stephanie Pratt, a 33 y.o. female, was seen for a [place] cancer genetics consultation at the request of Dr. 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  Stephanie Pratt presents to clinic today to discuss the results of genetic testing and to further clarify her future cancer risks, as well as potential cancer risks for family members.   Stephanie Pratt is a 33 y.o. female with no personal history of cancer.  She had genetic testing 08/14/2018 through [facility] panel that detected a deleterious BRIP1 c.2392C>T (p.Arg798*) variant. MyRisk panel evaluated for 35 genes known to be associated with hereditary cancer at the time. This included: APC, ATM, AXIN2, BARD1, BMPR1A, BRCA1, BRCA2, BRIP1, CDH1, CDK4, CDKN2A, CHEK2, EPCAM (large rearrangement only), HOXB13 (sequencing only), GALNT12, MLH1, MSH2, MSH3 (excluding repetitive portions of exon 1), MSH6, MUTYH, NBN, NTHL1, PALB2, PMS2, PTEN, RAD51C, RAD51D, RNF43, RPS20, SMAD4, STK11, TP53. Sequencing performed for select regions of POLE, POLD1, and large rearrangement performed for select regions of GREM1. Testing also identified a variant of unknown significance (VUS) in RNF43, c.1010G>A (p.Arg337Gln). Analysis was otherwise normal.   RISK FACTORS:  Menarche was at age 26-12.  First live birth at age 48.  OCP use for approximately  15+  years.  Ovaries intact: yes.  Hysterectomy: no.  Menopausal status: premenopausal.  HRT use: 0 years. Colonoscopy: yes;  reports completed around 2020, with polyps detected (unknown pathology and number). Five year f/u was recommended. Mammogram within the  last year: no. Number of breast biopsies: 0. Up to date with pelvic exams: yes. Any excessive radiation exposure in the past: no Reports height at 4'10" and weight of 155 lbs.   Past Medical History:  Diagnosis Date   Abnormal Pap smear of cervix    IBS (irritable bowel syndrome)     Past Surgical History:  Procedure Laterality Date   LAPAROSCOPIC APPENDECTOMY     LEEP     WISDOM TOOTH EXTRACTION      Social History   Socioeconomic History   Marital status: Single    Spouse name: Not on file   Number of children: 1   Years of education: Not on file   Highest education level: Associate degree: academic program  Occupational History   Occupation: student  Tobacco Use   Smoking status: Former    Current packs/day: 0.30    Average packs/day: 0.3 packs/day for 2.0 years (0.6 ttl pk-yrs)    Types: Cigarettes   Smokeless tobacco: Never  Vaping Use   Vaping status: Never Used  Substance and Sexual Activity   Alcohol use: Not Currently    Comment: occassional wine   Drug use: No   Sexual activity: Yes    Partners: Male  Other Topics Concern   Not on file  Social History Narrative   Not on file   Social Drivers of Health   Financial Resource Strain: Not on file  Food Insecurity: No Food Insecurity (04/23/2022)   Hunger Vital Sign    Worried About Running Out of Food in the Last Year: Never true    Ran Out of Food in the Last Year: Never true  Transportation  Needs: No Transportation Needs (04/23/2022)   PRAPARE - Administrator, Civil Service (Medical): No    Lack of Transportation (Non-Medical): No  Physical Activity: Not on file  Stress: Not on file  Social Connections: Unknown (08/25/2021)   [facility]   Social Network    Social Network: Not on file     FAMILY HISTORY:  We obtained a detailed, 4-generation family history.  Significant diagnoses are listed below: Family History  Problem Relation Age of Onset   Breast  cancer Mother 68   Diabetes Father    Heart disease Father    Hypertension Father    COPD Father    Congestive Heart Failure Father    Vascular Disease Father    Cancer Maternal Grandmother        brain   Breast cancer Other     Stephanie Pratt is unaware of previous family history of genetic testing for hereditary cancer risks. There is no reported Ashkenazi Jewish ancestry. There is no known consanguinity.  Stephanie Pratt reports her mother was diagnosed with breast cancer at age 46, living at age 19. Unsure if she has had genetic testing. She reports her maternal grandmother was diagnosed with cancer, primary unknown (possibly brain or lung with brain metastases), age of diagnosis and passing unknown. She reports a maternal great aunt (grandmother's sister) diagnosed with breast cancer, living.   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 Reports children had sweat test evaluation.     GENETIC TEST RESULTS:   We reviewed that Ms. Leth had genetic testing 08/14/2018 through [facility] panel that detected a deleterious BRIP1 c.2392C>T (p.Arg798*) variant. MyRisk panel evaluated for 35 genes known to be associated with hereditary cancer at the time. This included: APC, ATM, AXIN2, BARD1, BMPR1A, BRCA1, BRCA2, BRIP1, CDH1, CDK4, CDKN2A, CHEK2, EPCAM (large rearrangement only), HOXB13 (sequencing only), GALNT12, MLH1, MSH2, MSH3 (excluding repetitive portions of exon 1), MSH6, MUTYH, NBN, NTHL1, PALB2, PMS2, PTEN, RAD51C, RAD51D, RNF43, RPS20, SMAD4, STK11, TP53. Sequencing performed for select regions of POLE, POLD1, and large rearrangement performed for select regions of GREM1. Testing also identified a variant of unknown significance (VUS) in RNF43, c.1010G>A (p.Arg337Gln). Analysis was otherwise normal.   The BRIP1 gene makes a protein that typically works in our body to control cell growth, which is essential in  preventing cancer. The deleterious or pathogenic  c.2392C>T (p.Arg798*) variant identified in the BRIP1 gene is expected to cause a problem with how the gene works and is associated with an increased risk for cancer. Mutations in BRIP1 that affect the genes ability to work in the body are associated with an increased risk for ovarian cancer.   Genetic testing did identify a variant of uncertain significance (VUS) in the RNF43 gene called c.1010G>A (p.Arg337Gln).  At this time, it is unknown if this variant is associated with increased cancer risk or if this is a normal finding, but most variants such as this get reclassified to being inconsequential. It should not be used to make medical management decisions or identify at risk relatives. With time, we suspect the lab will determine the significance of this variant, if any. If we do learn more about it, we will try to contact @M @ @LNAME @ to discuss it further. However, it is important to stay in touch with [place] periodically and keep the address and phone number up to date.  Genetic testing was ordered by Stephanie Pratt's OBGYN, Dr. Nicholaus Bloom. Please see full report in media dated 08/14/2018. A portion of the result report is included below for reference.     CANCER RISKS AND MANAGEMENT RECOMMENDATIONS: Risks and recommendations per NCCN Genetic/Familial High Risk Assessment: Breast, Ovarian, Pancreatic and Prostate v3.2025.    Women with a mutation in BRIP1 have an increased risk of ovarian cancer (5-15% lifetime risk). Given the increased risk for ovarian cancer, it is recommended to consider a risk reducing salpingo-oophorectomy (surgery to remove ovaries and fallopian tubes) starting at age 51-50 or sooner if there is family history of earlier onset of ovarian cancer.   Previous studies of individuals with BRIP1 pathogenic or deleterious variants have reported conflicting information for female breast cancer risk, with some studies reporting an increased  risks and others reporting no risk for breast cancer. Data is currently insufficient to determine if there is a breast cancer risk associated with pathogenic BRIP1 variants. Therefore, breast cancer risk management should be guided by personal and family history.   The Tyrer-Cuzick model is one of multiple prediction models developed to estimate an individual's lifetime risk of developing breast cancer. The Tyrer-Cuzick model is endorsed by the [employer] (NCCN). This model includes many risk factors such as family history, endogenous estrogen exposure, and benign breast disease. The calculation is highly-dependent on the accuracy of clinical data provided by the patient and can change over time. The Tyrer-Cuzick model may be repeated to reflect new information in her personal or family history in the future.   Based on the patient's family history, a statistical model (IBIS v8b/Tyrer Amelia Jo) was used to estimate her risk of developing breast cancer. This estimates her lifetime risk of developing breast to be approximately 14.7%. This estimation does not consider any genetic testing results.  The patient's lifetime breast cancer risk is a preliminary estimate based on available information using one of several models endorsed by the American Cancer Society (ACS). The ACS recommends consideration of breast MRI screening as an adjunct to mammography for patients at high risk (defined as 20% or greater lifetime risk).   Per NCCN, given that the model calculated her risk at <20%, it is recommended that Stephanie Pratt:  Until age 64:  Breast awareness Clinical encounter every 1-3 years  Over the age of 50:  Breast awareness  Annual clinical encounter Annual screening mammogram   Please note that a woman's breast cancer risk changes over time. It may increase or decrease based on age and any changes to the personal and/or family medical history. The risks and recommendations listed  above apply to this patient at this point in time. In the future, she may or may not be eligible for the same medical management strategies and, in some cases, other medical management strategies may become available to her. If she is interested in an updated breast cancer risk assessment at a later date, she can contact us.    Currently, men are not thought to have any personal cancer risks if they have a pathogenic BRIP1 variant; however, they could pass this on to female children or descendants. Over time, our understanding of cancer risks for individuals with BRIP1 mutations may evolve and additional cancer risks may become known.   For individuals with an identified gene mutation, reproductive options are available including testing on-going pregnancies, testing children when they are of age, or IVF with the option to test embryos for mutations already found in the  family. These options may be explored in greater detail with a reproductive or prenatal genetic counselor for more details.  RECOMMENDATIONS FOR FAMILY MEMBERS: If someone has a pathogenic variant in BRIP1, their first degree relatives (parents, children, and siblings) have a 50% chance of having the same genetic change. These family members should consider having genetic testing after age 69. Extended relatives have a chance to have the same BRIP1 variant as well and may benefit from testing.   People who have pathogenic variants in both copies of the BRIP1 gene have a condition called Fanconi Anemia (FA). This rare condition can happen if both parents have a pathogenic variant in one copy of the BRIP1 gene. If both parents do, then the chance to have a child with FA is 25%. If someone has a pathogenic variant in BRIP1, their partner can be tested to determine if there is a chance their child could have FA. This testing is sometimes called 'carrier screening.' FA can cause problems with development before birth such as slow growth,  problems with bone formation, small head, differences in the eyes, problems with the kidneys, problems with the reproductive organs, hearing loss, or heart defects. After bith, FA causes the bone marrow, which makes blood cells, to stop working. Children may need medication, blood transfusions, or a bone marrow transplant to have enough blood cells.  FA can also increase the risk of cancer such as leukemia.   Based on Stephanie Pratt's family history, we recommended her mother, who was diagnosed with breast cancer at age 7, have genetic counseling and testing. Stephanie Pratt will let us know if we can be of any assistance in coordinating genetic counseling and/or testing for this family member. Genetic testing is recommended for Stephanie Pratt's maternal and paternal relatives, especially first degree relatives. First degree relatives, such as parents, siblings, and children have up to a 50% chance to carry the same BRIP1 c.2392C>T (p.Arg798*) mutation reported for Stephanie Pratt.   ADDITIONAL GENETIC TESTING:  Stephanie Pratt did have questions regarding need for additional genetic testing. We discussed that, in general, most cancer is not inherited in families, but instead is sporadic or familial. Sporadic cancers occur by chance and typically happen at older ages (>50 years) as this type of cancer is caused by genetic changes acquired during an individual's lifetime. Some families have more cancers than would be expected by chance; however, the ages or types of cancer are not consistent with a known genetic mutation or known genetic mutations have been ruled out. This type of familial cancer is thought to be due to a combination of multiple genetic, environmental, hormonal, and lifestyle factors. While this combination of factors likely increases the risk of cancer, the exact source of this risk is not currently identifiable or testable.  We discussed that 5 - 10% of cancer is hereditary. We reviewed the  characteristics, features and inheritance patterns of hereditary cancer syndromes. We discussed that additional genetic testing for Stephanie Pratt is not indicated at this time, but may be available in the future with changes to her personal and/or family history, or changes to testing technology. We encourage that she keep in touch with our office to stay up to date on recommendations.   PLAN:  Referral to GYN Oncology to further discuss preventative recommendations for individuals with BRIP1 deleterious variants.   Individuals in this family might be at some increased risk of developing cancer, over the general population risk, simply due to the family history of cancer.  We recommended women in this family have a yearly mammogram beginning at age 80, or 20 years younger than the earliest onset of cancer (whichever comes first), an annual clinical breast exam, and perform monthly breast self-exams. Women in this family should also have a gynecological exam as recommended by their primary provider. All family members should be referred for colonoscopy starting at age 52, or 69 years younger than the earliest onset of cancer.  Lastly, we encouraged Stephanie Pratt to remain in contact with cancer genetics annually so that we can continuously update the family history and inform her of any changes in cancer genetics and testing that may be of benefit for this family. Additionally, we encourage her to update [place] with information regarding any additional cancer diagnoses in the family, as this could impact risk model calculations.   Stephanie Pratt questions were answered to her satisfaction today. Our contact information was provided should additional questions or concerns arise. Thank you for the referral and allowing [place] to share in the care of your patient.   Vassie Moment, MS, [facility] Licensed, Retail [email or web address] phone: [phone]  55 minutes were spent on the date of the  encounter in service to the patient including preparation, face-to-face consultation, documentation and care coordination.   The patient was seen alone. Drs. Meliton Rattan, and/or [place] were available for questions, if needed.  _______________________________________________________________________ For Office Staff:  Number of people involved in session: 1 Was an Intern/ student involved with case: no

## 2023-07-08 ENCOUNTER — Telehealth: Payer: Self-pay

## 2023-07-08 NOTE — Telephone Encounter (Signed)
 LVM for Ms.Bochicchio to call office regarding a new patient referral from [facility].  Available appointment with Dr.Jackson-Moore on 4/8 (new patient 30 minute slot)

## 2023-07-11 NOTE — Telephone Encounter (Signed)
 Lvm for Ms.Monte to call office regarding a new patient referral to see Dr.Jackson-Moore 4/8

## 2023-07-11 NOTE — Telephone Encounter (Signed)
 Spoke with Stephanie Pratt regarding her referral to GYN oncology. She has an appointment scheduled with Dr. Tamela Oddi  on 4/8 at 2:45. Patient agrees to date and time. She has been provided with office address and location. She is also aware of our mask and visitor policy. Patient verbalized understanding and will call with any questions.

## 2023-07-20 ENCOUNTER — Encounter: Payer: Self-pay | Admitting: Obstetrics & Gynecology

## 2023-07-26 ENCOUNTER — Inpatient Hospital Stay: Admitting: Obstetrics & Gynecology

## 2023-07-26 ENCOUNTER — Telehealth: Payer: Self-pay | Admitting: *Deleted

## 2023-07-26 DIAGNOSIS — Z1589 Genetic susceptibility to other disease: Secondary | ICD-10-CM

## 2023-07-26 NOTE — Telephone Encounter (Signed)
 Spoke with Stephanie Pratt who called the office to reschedule her appt. Today with Dr. Delora Fuel due to her infant son being sick. Pt was given a new appt. For April 23 rd at 2:45. Pt agreed to date and time and had no further concerns at this time.

## 2023-08-10 ENCOUNTER — Encounter: Payer: Self-pay | Admitting: Obstetrics & Gynecology

## 2023-08-10 ENCOUNTER — Inpatient Hospital Stay: Attending: Nurse Practitioner | Admitting: Obstetrics & Gynecology

## 2023-08-10 VITALS — BP 113/64 | HR 60 | Temp 98.5°F | Resp 17 | Ht 59.0 in | Wt 148.0 lb

## 2023-08-10 DIAGNOSIS — Z1502 Genetic susceptibility to malignant neoplasm of ovary: Secondary | ICD-10-CM | POA: Insufficient documentation

## 2023-08-10 DIAGNOSIS — Z803 Family history of malignant neoplasm of breast: Secondary | ICD-10-CM | POA: Diagnosis not present

## 2023-08-10 DIAGNOSIS — Z1589 Genetic susceptibility to other disease: Secondary | ICD-10-CM

## 2023-08-10 DIAGNOSIS — Z148 Genetic carrier of other disease: Secondary | ICD-10-CM | POA: Diagnosis present

## 2023-08-10 NOTE — Progress Notes (Signed)
   Follow Up Note: Gyn-Onc  Stephanie Pratt 33 y.o. female  CC: Counseling re: BRIP 1 mutation   HPI: Discussed the use of AI scribe software for clinical note transcription with the patient, who gave verbal consent to proceed.  History of Present Illness Stephanie Pratt is a 33 year old female who presents for consultation regarding risk-reducing surgery due to testing positive for a BRIP 1 mutation variant.  She is concerned about her increased risk of ovarian cancer.   She has completed her family, having a 35 year old and a 90-month-old, and does not wish to have more children. She had a traumatic delivery with her youngest child and feels 'really done' with childbearing. She is considering having her fallopian tubes removed as a form of sterilization and risk reduction,  Her family history is significant for her mother having breast cancer.,    Review of Systems  Review of Systems  Constitutional:  Negative for malaise/fatigue and weight loss.  Respiratory:  Negative for shortness of breath and wheezing.   Cardiovascular:  Negative for chest pain and leg swelling.  Gastrointestinal:  Negative for abdominal pain, blood in stool, constipation, nausea and vomiting.  Genitourinary:  Negative for dysuria, frequency, hematuria and urgency.  Musculoskeletal:  Negative for joint pain and myalgias.  Neurological:  Negative for weakness.  Psychiatric/Behavioral:  Negative for depression. The patient does not have insomnia.    Current medications, allergy, social history, past surgical history, past medical history, family history were all reviewed.    Vitals:  Blood pressure 113/64, pulse 60, temperature 98.5 F (36.9 C), temperature source Oral, resp. rate 17, height 4\' 11"  (1.499 m), weight 148 lb (67.1 kg), SpO2 99%, unknown if currently breastfeeding.  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  - Consider  salpingectomy for sterilization and risk reduction. Researchers at [facility] are studying whether salpingectomy while delaying oophorectomy until closer to the age of natural menopause is a safe option for lowering risk in people who are not ready to remove their ovaries. She was encourage to contact us  if she is consider enrolling in a research study. - Recommend rrBSO at age 21 to 67 years, per [employer] guidelines -  Counseled that use of oral contraceptives may reduce the risk of ovarian cancer, as observed in females with pathogenic variants in BRCA1/2   - Advise against routine ultrasounds and CA-125 screenings. - Encourage regular GYN follow-ups and symptom monitoring.  Increased risk of breast cancer due to family history Moderate risk due to family history. Specific mutation not strongly linked to breast cancer. Mother had breast cancer. - Consider starting mammograms and breast exams by age 59.   I personally spent 30 minutes face-to-face and non-face-to-face in the care of this patient, which includes all pre, intra, and post visit time on the date of service.    Abdul Hodgkin, MD

## 2023-10-03 ENCOUNTER — Telehealth: Payer: Self-pay | Admitting: *Deleted

## 2023-10-03 NOTE — Telephone Encounter (Signed)
 Patient advised to contact pharmacy for them to fax a refill request if they are having an issue with the refills that she already has.

## 2023-10-10 ENCOUNTER — Other Ambulatory Visit: Payer: Self-pay | Admitting: *Deleted

## 2023-10-10 MED ORDER — NORETHINDRONE 0.35 MG PO TABS: 1.0000 | ORAL_TABLET | Freq: Every day | ORAL | 3 refills | Status: AC
